# Patient Record
Sex: Female | Born: 1960 | Race: White | Hispanic: No | Marital: Married | Smoking: Current every day smoker
Health system: Southern US, Community
[De-identification: ages and names within clinical notes are randomized; demographics above are authoritative.]

## PROBLEM LIST (undated history)

## (undated) DIAGNOSIS — E785 Hyperlipidemia, unspecified: Secondary | ICD-10-CM

## (undated) DIAGNOSIS — M5020 Other cervical disc displacement, unspecified cervical region: Secondary | ICD-10-CM

## (undated) DIAGNOSIS — Z720 Tobacco use: Secondary | ICD-10-CM

## (undated) DIAGNOSIS — T7840XA Allergy, unspecified, initial encounter: Secondary | ICD-10-CM

## (undated) HISTORY — PX: COLONOSCOPY: SHX174

## (undated) HISTORY — DX: Other cervical disc displacement, unspecified cervical region: M50.20

## (undated) HISTORY — DX: Allergy, unspecified, initial encounter: T78.40XA

## (undated) HISTORY — DX: Tobacco use: Z72.0

## (undated) HISTORY — PX: ABDOMINAL HYSTERECTOMY: SHX81

## (undated) HISTORY — PX: SPINE SURGERY: SHX786

## (undated) HISTORY — DX: Hyperlipidemia, unspecified: E78.5

---

## 1999-03-04 ENCOUNTER — Emergency Department (HOSPITAL_COMMUNITY): Admission: EM | Admit: 1999-03-04 | Discharge: 1999-03-04 | Payer: Self-pay | Admitting: Emergency Medicine

## 1999-03-04 ENCOUNTER — Encounter: Payer: Self-pay | Admitting: Emergency Medicine

## 1999-03-09 HISTORY — PX: VAGINAL HYSTERECTOMY: SUR661

## 1999-03-10 ENCOUNTER — Encounter: Admission: RE | Admit: 1999-03-10 | Discharge: 1999-03-10 | Payer: Self-pay | Admitting: Family Medicine

## 1999-04-14 ENCOUNTER — Encounter: Admission: RE | Admit: 1999-04-14 | Discharge: 1999-04-14 | Payer: Self-pay | Admitting: Family Medicine

## 1999-04-16 ENCOUNTER — Encounter: Admission: RE | Admit: 1999-04-16 | Discharge: 1999-04-16 | Payer: Self-pay | Admitting: Family Medicine

## 1999-04-16 ENCOUNTER — Encounter: Payer: Self-pay | Admitting: Family Medicine

## 1999-05-04 ENCOUNTER — Encounter: Payer: Self-pay | Admitting: Family Medicine

## 1999-05-04 ENCOUNTER — Encounter: Admission: RE | Admit: 1999-05-04 | Discharge: 1999-05-04 | Payer: Self-pay | Admitting: Family Medicine

## 1999-07-16 ENCOUNTER — Encounter: Payer: Self-pay | Admitting: Obstetrics and Gynecology

## 1999-07-21 ENCOUNTER — Encounter (INDEPENDENT_AMBULATORY_CARE_PROVIDER_SITE_OTHER): Payer: Self-pay

## 1999-07-21 ENCOUNTER — Inpatient Hospital Stay (HOSPITAL_COMMUNITY): Admission: RE | Admit: 1999-07-21 | Discharge: 1999-07-22 | Payer: Self-pay | Admitting: Obstetrics and Gynecology

## 2000-06-09 ENCOUNTER — Encounter: Admission: RE | Admit: 2000-06-09 | Discharge: 2000-06-09 | Payer: Self-pay | Admitting: Family Medicine

## 2000-06-10 ENCOUNTER — Encounter: Admission: RE | Admit: 2000-06-10 | Discharge: 2000-06-10 | Payer: Self-pay | Admitting: Family Medicine

## 2000-06-10 ENCOUNTER — Encounter: Payer: Self-pay | Admitting: Family Medicine

## 2002-02-06 ENCOUNTER — Encounter: Admission: RE | Admit: 2002-02-06 | Discharge: 2002-02-06 | Payer: Self-pay | Admitting: Family Medicine

## 2003-04-29 ENCOUNTER — Encounter: Admission: RE | Admit: 2003-04-29 | Discharge: 2003-04-29 | Payer: Self-pay | Admitting: Family Medicine

## 2004-09-10 ENCOUNTER — Ambulatory Visit: Payer: Self-pay | Admitting: Family Medicine

## 2004-09-10 ENCOUNTER — Encounter (INDEPENDENT_AMBULATORY_CARE_PROVIDER_SITE_OTHER): Payer: Self-pay | Admitting: *Deleted

## 2004-09-10 LAB — CONVERTED CEMR LAB

## 2004-09-15 ENCOUNTER — Encounter: Admission: RE | Admit: 2004-09-15 | Discharge: 2004-09-15 | Payer: Self-pay | Admitting: Sports Medicine

## 2006-05-05 DIAGNOSIS — K219 Gastro-esophageal reflux disease without esophagitis: Secondary | ICD-10-CM

## 2006-05-05 DIAGNOSIS — F17200 Nicotine dependence, unspecified, uncomplicated: Secondary | ICD-10-CM

## 2006-05-05 DIAGNOSIS — D62 Acute posthemorrhagic anemia: Secondary | ICD-10-CM

## 2006-05-06 ENCOUNTER — Encounter (INDEPENDENT_AMBULATORY_CARE_PROVIDER_SITE_OTHER): Payer: Self-pay | Admitting: *Deleted

## 2007-08-12 ENCOUNTER — Emergency Department (HOSPITAL_COMMUNITY): Admission: EM | Admit: 2007-08-12 | Discharge: 2007-08-12 | Payer: Self-pay | Admitting: Emergency Medicine

## 2007-08-18 ENCOUNTER — Ambulatory Visit: Payer: Self-pay

## 2007-09-05 ENCOUNTER — Ambulatory Visit: Payer: Self-pay | Admitting: Cardiovascular Disease

## 2008-01-09 ENCOUNTER — Ambulatory Visit: Payer: Self-pay | Admitting: Family Medicine

## 2008-01-09 ENCOUNTER — Telehealth: Payer: Self-pay | Admitting: *Deleted

## 2008-01-09 DIAGNOSIS — R599 Enlarged lymph nodes, unspecified: Secondary | ICD-10-CM | POA: Insufficient documentation

## 2008-01-12 ENCOUNTER — Telehealth: Payer: Self-pay | Admitting: *Deleted

## 2008-03-14 ENCOUNTER — Ambulatory Visit: Payer: Self-pay | Admitting: Family Medicine

## 2008-03-14 ENCOUNTER — Telehealth: Payer: Self-pay | Admitting: *Deleted

## 2008-03-18 ENCOUNTER — Encounter (INDEPENDENT_AMBULATORY_CARE_PROVIDER_SITE_OTHER): Payer: Self-pay | Admitting: Family Medicine

## 2008-03-18 ENCOUNTER — Ambulatory Visit: Payer: Self-pay | Admitting: Family Medicine

## 2008-03-18 ENCOUNTER — Telehealth: Payer: Self-pay | Admitting: *Deleted

## 2008-07-04 ENCOUNTER — Emergency Department (HOSPITAL_COMMUNITY): Admission: EM | Admit: 2008-07-04 | Discharge: 2008-07-04 | Payer: Self-pay | Admitting: Family Medicine

## 2008-08-30 ENCOUNTER — Inpatient Hospital Stay (HOSPITAL_COMMUNITY): Admission: RE | Admit: 2008-08-30 | Discharge: 2008-09-01 | Payer: Self-pay | Admitting: Neurosurgery

## 2008-10-26 DIAGNOSIS — Z8679 Personal history of other diseases of the circulatory system: Secondary | ICD-10-CM | POA: Insufficient documentation

## 2009-02-26 ENCOUNTER — Telehealth: Payer: Self-pay | Admitting: Family Medicine

## 2009-03-03 ENCOUNTER — Ambulatory Visit: Payer: Self-pay | Admitting: Family Medicine

## 2009-03-03 DIAGNOSIS — H60339 Swimmer's ear, unspecified ear: Secondary | ICD-10-CM

## 2009-04-11 ENCOUNTER — Encounter: Payer: Self-pay | Admitting: Family Medicine

## 2009-08-05 ENCOUNTER — Ambulatory Visit (HOSPITAL_COMMUNITY): Admission: RE | Admit: 2009-08-05 | Discharge: 2009-08-05 | Payer: Self-pay | Admitting: Neurosurgery

## 2009-09-09 ENCOUNTER — Encounter: Payer: Self-pay | Admitting: Family Medicine

## 2009-09-09 ENCOUNTER — Ambulatory Visit: Payer: Self-pay | Admitting: Family Medicine

## 2009-09-09 DIAGNOSIS — J329 Chronic sinusitis, unspecified: Secondary | ICD-10-CM | POA: Insufficient documentation

## 2009-09-10 LAB — CONVERTED CEMR LAB
Albumin: 4.6 g/dL (ref 3.5–5.2)
Chloride: 108 meq/L (ref 96–112)
Glucose, Bld: 77 mg/dL (ref 70–99)
Hemoglobin: 15.5 g/dL — ABNORMAL HIGH (ref 12.0–15.0)
MCHC: 33.9 g/dL (ref 30.0–36.0)
MCV: 93.1 fL (ref 78.0–100.0)
Platelets: 295 10*3/uL (ref 150–400)
Potassium: 4.3 meq/L (ref 3.5–5.3)
RBC: 4.91 M/uL (ref 3.87–5.11)
TSH: 3.828 microintl units/mL (ref 0.350–4.500)
VLDL: 16 mg/dL (ref 0–40)
WBC: 8.7 10*3/uL (ref 4.0–10.5)

## 2009-09-15 ENCOUNTER — Encounter: Payer: Self-pay | Admitting: Family Medicine

## 2009-09-30 ENCOUNTER — Encounter: Payer: Self-pay | Admitting: Family Medicine

## 2010-04-07 NOTE — Consult Note (Signed)
Summary: [facility]  [facility]   Imported By: Clydell Hakim 04/18/2009 10:58:55  _____________________________________________________________________  External Attachment:    Type:   Image     Comment:   External Document

## 2010-04-07 NOTE — Miscellaneous (Signed)
  Clinical Lists Changes  Observations: Added new observation of DM PROGRESS: N/A (09/30/2009 8:24) Added new observation of DM FSREVIEW: N/A (09/30/2009 8:24) Added new observation of HTN PROGRESS: N/A (09/30/2009 8:24) Added new observation of HTN FSREVIEW: N/A (09/30/2009 8:24) Added new observation of LIPID PROGRS: N/A (09/30/2009 8:24) Added new observation of LIPID FSREVW: N/A (09/30/2009 8:24) Added new observation of MAMMOGRAM: normal (09/26/2009 8:25)      Prevention & Chronic Care Immunizations   Influenza vaccine: Not documented    Tetanus booster: 04/08/2001: Done.   Tetanus booster due: 04/09/2011    Pneumococcal vaccine: Not documented  Colorectal Screening   Hemoccult: Done.  (03/09/1999)   Hemoccult action/deferral: Not indicated  (09/09/2009)   Hemoccult due: 03/08/2000    Colonoscopy: Not documented   Colonoscopy action/deferral: GI referral  (09/09/2009)  Other Screening   Pap smear: Done.  (09/10/2004)   Pap smear action/deferral: Not indicated S/P hysterectomy  (09/09/2009)   Pap smear due: 09/10/2005    Mammogram: normal  (09/26/2009)   Mammogram action/deferral: Ordered  (09/09/2009)   Mammogram due: 03/16/2007   Smoking status: current  (09/09/2009)   Smoking cessation counseling: yes  (03/18/2008)  Lipids   Total Cholesterol: 210  (09/09/2009)   LDL: 147  (09/09/2009)   LDL Direct: Not documented   HDL: 47  (09/09/2009)   Triglycerides: 81  (09/09/2009)    Mammogram  Procedure date:  09/26/2009  Findings:      normal

## 2010-04-07 NOTE — Assessment & Plan Note (Signed)
Summary: cpe,df   Vital Signs:  Patient profile:   50 year old female Height:      66 inches Weight:      165.8 pounds BMI:     26.86 BP sitting:   137 / 61  (left arm) Cuff size:   regular  Vitals Entered By: Jimmy Footman, CMA (September 09, 2009 8:36 AM) CC: Adult phys Is Patient Diabetic? No Pain Assessment Patient in pain? no        Primary Care Provider:  Luretha Murphy NP  CC:  Adult phys.  History of Present Illness: Here for health maintenence exam.  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  Since then Kathleen Navarro has had severe consitpation.    Hysterectomy for fibroids in 2009, has occasional sensation of hormonal changes.  Chronic sinus problems, smokes and would like to quit.  Using loratadine to control symptoms and it seems to help.  GERD is improved and off meds.  Habits & Providers  Alcohol-Tobacco-Diet     Tobacco Status: current  Current Medications (verified): 1)  Wellbutrin 100 Mg Tabs (Bupropion Hcl) .... Two To Three Time Per Day  Allergies (verified): No Known Drug Allergies  Past History:  Past Medical History: Last updated: 10/26/2008 Current Problems:  CHEST PAIN, HX OF (ICD-V12.50): atypical normal myovue 08/2007 Palpitatons CERVICAL LYMPHADENOPATHY, ANTERIOR, LEFT (ICD-785.6) TOBACCO DEPENDENCE (ICD-305.1) GASTROESOPHAGEAL REFLUX, NO ESOPHAGITIS (ICD-530.81) ANEMIA, ACUTE BLOOD LOSS (ICD-285.1)    Review of Systems General:  Denies chills, fever, loss of appetite, malaise, sweats, and weakness. ENT:  Complains of postnasal drainage; denies difficulty swallowing, ear discharge, nasal congestion, ringing in ears, sinus pressure, and sore throat. Resp:  Denies cough, sputum productive, and wheezing. GI:  Complains of constipation, gas, and indigestion; denies abdominal pain. GU:  Denies discharge and dysuria. MS:  Denies joint pain.  Physical Exam  General:  Well appearing,  well groomed Ears:  TM retracted and reddened Nose:  no external deformity, no external erythema, and no nasal discharge.   Mouth:  good dentition, no dental plaque, and pharynx pink and moist.  Post nasal drainage present. Neck:  right anterior cervical node palpable, non tender Breasts:  No mass, nodules, thickening, tenderness, bulging, retraction, inflamation, nipple discharge or skin changes noted.   Lungs:  Normal respiratory effort, chest expands symmetrically. Lungs are clear to auscultation, no crackles or wheezes. Heart:  Normal rate and regular rhythm. S1 and S2 normal without gallop, murmur, click, rub or other extra sounds. Abdomen:  Bowel sounds positive,abdomen soft and non-tender without masses, organomegaly or hernias noted. Genitalia:  normal introitus, no external lesions, no vaginal discharge, and mucosa pink and moist.  Absent cervix. Msk:  normal ROM.   Pulses:  R and L carotid,radial,femoral,dorsalis pedis and posterior tibial pulses are full and equal bilaterally Extremities:  No clubbing, cyanosis, edema, or deformity noted with normal full range of motion of all joints.   Neurologic:  alert & oriented X3, cranial nerves II-XII intact, strength normal in all extremities, gait normal, DTRs symmetrical and normal, and finger-to-nose normal.   Skin:  Intact without suspicious lesions or rashes Cervical Nodes:  right anterior single node palpable Psych:  Cognition and judgment appear intact. Alert and cooperative.   Impression & Recommendations:  Problem # 1:  HEALTH MAINTENANCE EXAM (ICD-V70.0) Patient will schedule own colonoscopy and mammogram Orders: Comp Met-[facility] [id number]) Lipid-[facility] ([id number]) CBC-[facility] (29562) TSH-[facility] [phone]) [facility] -  Est  40-64 yrs [phone])  Problem # 2:  TOBACCO DEPENDENCE (ICD-305.1) Spend a lot of time discussing plan for this, prescribed Wellburtrin for when Kathleen Navarro decides to quit.  Kathleen Navarro cannot come to the class but will contact [place]  Quit.  Problem # 3:  CERVICAL LYMPHADENOPATHY (ICD-785.6) Has had past documentation on the left, today palpable on the right.  Chronic sinus problems, Kathleen Navarro will check and return if present in several months. The following medications were removed from the medication list:    Amoxicillin 500 Mg Tabs (Amoxicillin) .Marland Kitchen... 1 by mouth three times a day x 10 days  Problem # 4:  SINUSITIS, CHRONIC (ICD-473.9) nasal saline, smoking cessation, antihistamine; patient does not like the nasal steroid The following medications were removed from the medication list:    Amoxicillin 500 Mg Tabs (Amoxicillin) .Marland Kitchen... 1 by mouth three times a day x 10 days    Fluticasone Propionate 50 Mcg/act Susp (Fluticasone propionate) .Marland Kitchen... 2 sprays each nostril daily disp: one  Complete Medication List: 1)  Wellbutrin 100 Mg Tabs (Bupropion hcl) .... Two to three time per day  Patient Instructions: 1)  Polyethelyne Glycol-daily 2)  Cetirizine  3)  Please Quit Smoking Prescriptions: WELLBUTRIN 100 MG TABS (BUPROPION HCL) two to three time per day Brand medically necessary #90 x 3   Entered and Authorized by:   Luretha Murphy NP   Signed by:   Luretha Murphy NP on 09/09/2009   Method used:   Print then Give to Patient   RxID:   [id number]    Prevention & Chronic Care Immunizations   Influenza vaccine: Not documented    Tetanus booster: 04/08/2001: Done.   Tetanus booster due: 04/09/2011    Pneumococcal vaccine: Not documented  Colorectal Screening   Hemoccult: Done.  (03/09/1999)   Hemoccult action/deferral: Not indicated  (09/09/2009)   Hemoccult due: 03/08/2000    Colonoscopy: Not documented   Colonoscopy action/deferral: GI referral  (09/09/2009)  Other Screening   Pap smear: Done.  (09/10/2004)   Pap smear action/deferral: Not indicated S/P hysterectomy  (09/09/2009)   Pap smear due: 09/10/2005    Mammogram: Done.  (03/15/2006)   Mammogram action/deferral: Ordered  (09/09/2009)   Mammogram due:  03/16/2007   Smoking status: current  (09/09/2009)   Smoking cessation counseling: yes  (03/18/2008)  Lipids   Total Cholesterol: Not documented   LDL: Not documented   LDL Direct: Not documented   HDL: Not documented   Triglycerides: Not documented

## 2010-04-15 ENCOUNTER — Encounter: Payer: Self-pay | Admitting: *Deleted

## 2010-06-15 LAB — CBC
HCT: 45.1 % (ref 36.0–46.0)
Hemoglobin: 15.6 g/dL — ABNORMAL HIGH (ref 12.0–15.0)
MCHC: 34.5 g/dL (ref 30.0–36.0)
Platelets: 236 10*3/uL (ref 150–400)
RBC: 4.9 MIL/uL (ref 3.87–5.11)
RDW: 13 % (ref 11.5–15.5)

## 2010-07-21 NOTE — Assessment & Plan Note (Signed)
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  She was there on August 12, 2007, for chest pain.  She had  been having pains for 3-4 days prior to her ER visit.  The pain is  atypical.  It was nonexertional.  It is in the center of her chest that  lasted minutes.  It could radiate towards her back.  It subsequently  started to last longer.  She was seen in the ER.  She was not given  nitro.  The pain subsided after a while.  She ruled out myocardial  infarction and had a normal chest x-ray and EKG.  She subsequently was  referred to our office and had a Myoview study, which was done on August 18, 2007.  I personally reviewed the study, the EKG tracings, and the  actual images.  They were totally normal with an EF of 74%.  I went over  this with the patient.  Since that time, she continues to have atypical  symptoms.  She describes pain in her chest that is intermittent, not  related to food.  It is not exacerbated by recumbency.  They tend to  occur late morning or early afternoon, but there is no particular  activity that seems to bring them on.  There is nothing that she can do  to make them go away quicker.  She describes them as a pain in the  center of her chest.  There is no associated shortness of breath or  diaphoresis.  She subsequently can feel weak for a few minutes.  She  notices occasional PVCs, but in general does not have palpitations,  presyncope, or rapid heart rhythms.   REVIEW OF SYSTEMS:  Otherwise, negative.   PAST MEDICAL HISTORY:  Benign.  She has had a hysterectomy.   She is prone to anemia.  She has had fatigue and reflux.   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  She is happily married.  She has 3 older children and 4  grandchildren.  She does not drink.  She does smoke a pack a day.   FAMILY HISTORY:  Remarkable for her mother dying of cancer at age 38.  Father died of an MI at age 48.   MEDICATIONS:  She is currently not taking any medications.   ALLERGIES:  She is allergic to [facility].   PHYSICAL EXAMINATION:  Remarkable for a healthy-appearing 50 year old  female in no distress.  Affect is appropriate.  Blood pressure is 110/74, pulse 84 and regular, respiratory 14 and  afebrile, and weight is 121.  HEENT:  Unremarkable.  Carotids are normal without bruit.  No lymphadenopathy, thyromegaly, or  JVP elevation.  LUNGS:  Clear.  Good diaphragmatic motion.  No wheezing.  S1 and S2.  Normal heart  sounds.  PMI normal.  ABDOMEN:  Benign.  Bowel sounds positive.  Status post hysterectomy.  No  AAA.  No tenderness.  No bruit.  No hepatosplenomegaly.  No  hepatojugular reflux.  Distal pulses are intact.  No edema.  NEUROLOGIC:  Nonfocal.  SKIN:  Warm and dry.  No muscular weakness.   EKG:  Normal.  Chest x-ray from the ER on the August 12, 2007, normal.   IMPRESSION:  1. Atypical chest pain.  I told the patient to call me if her symptoms      become more exertional and more frequent.  I would give her some      sublingual nitro to try.  I also think that if her symptoms were to      change in character or increase in intensity to require another ER      visit that she would be a good candidate for cardiac CT.  For the      time being, I think with a normal Myoview, observation is still in      order.  2. Smoking.  I discussed with the patient briefly regarding smoking      cessation.  She will follow with her primary care MD.  Again, she      would be a reasonable candidate for Wellbutrin.  3. Palpitations.  Occasional premature ventricular contractions,      benign.  No evidence of decreased left ventricular  function.      Continue to monitor.     [name]. Eden Emms, MD, [facility]  Electronically Signed    PCN/MedQ  DD: 09/05/2007  DT: 09/06/2007  Job #: [id number]

## 2010-07-21 NOTE — Op Note (Signed)
NAMEMICHE, LOUGHRIDGE            ACCOUNT NO.:  [id number]   MEDICAL RECORD NO.:  [id number]          PATIENT TYPE:  INP   LOCATION:  3030                         FACILITY:  [facility]   PHYSICIAN:  Hilda Lias, M.D.   DATE OF BIRTH:  14-Jun-1960   DATE OF PROCEDURE:  08/30/2008  DATE OF DISCHARGE:                               OPERATIVE REPORT   PREOPERATIVE DIAGNOSES:  C4-C5, C5-C6 spondylosis, herniated disk with  right 5 and 6 radiculopathy.   POSTOPERATIVE DIAGNOSES:  C4-C5, C5-C6 spondylosis, herniated disk with  right 5 and 6 radiculopathy.   PROCEDURE:  Anterior 4-5, 5-6 diskectomy, decompression of spinal cord,  bilateral foraminotomy, interbody fusion with auto and allograft plate,  microscope.   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  She has complained of neck pain radiating to  right upper extremity.  This problem had been going for more than 4  weeks and she is not any better.  She had quite a bit of weakness of the  right deltoid and biceps.  X-ray shows stenosis with __________  herniated disk at the level of 4-5 and 5-6.  Surgery was advised in view  of no improvement.   PROCEDURE:  The patient was taken to the OR and after intubation, the  left side of the neck was cleaned with DuraPrep.  Transverse incision  was made through the skin, platysma, down to the cervical area.  X-rays  showed that indeed we were at the level of 5-6.  Then, the anterior  ligament of 4-5 and 5-6 were removed as well as the osteophyte.  We  started at the level of 4-5 where we did a total diskectomy, removing a  quite a bit of degenerative disk disease.  After we opened the posterior  ligament, we found that the both foramen were closed, the right worse  than the left one.  Using the drill as well as the 1- and 2-mm Kerrison  punch, we did a foraminotomy to decompress  the C5 nerve root  bilaterally.  At the level 5-6, we have opened the posterior ligament,  we found a piece of disk going to the right side.  The removal was done  with decompression of C6 nerve root.  The C6 nerve root on the right  side was swollen.  Foraminotomy on the left side was accomplished.  This  was followed with __________ 7-mm height, with autograft inside as well  as BMX.  Then, a plate using 6 screws was used to keep the graft in  place.  Lateral cervical spine showed good position of the graft as well  as the plate.  Hemostasis was accomplished.  We waited 5 minutes to be  sure that we had good hemostasis and in view of no bleeding, the wound  was closed with Vicryl and Steri-Strips.           ______________________________  Hilda Lias, M.D.     EB/MEDQ  D:  08/30/2008  T:  08/31/2008  Job:  [id number]

## 2010-07-24 NOTE — Op Note (Signed)
[facility]  Patient:    Kathleen Navarro, Kathleen Navarro               MRN: [id number] Proc. Date: 07/21/99 Adm. Date:  [id number] Disc. Date: [id number] Attending:  Jenean Lindau CC:         Wayne A. Sheffield Slider, M.D.             Laqueta Linden, M.D.                           Operative Report  PREOPERATIVE DIAGNOSIS:  Leiomyomata uteri, menorrhagia, anemia.  POSTOPERATIVE DIAGNOSIS:  Leiomyomata uteri, menorrhagia, anemia.  PROCEDURE:  Transvaginal hysterectomy.  SURGEON:  Laqueta Linden, M.D.  ASSISTANT:  Andres Ege, M.D.  ANESTHESIA:  Epidural with IV sedation.  ESTIMATED BLOOD LOSS:  Less than 50 cc.  URINE OUTPUT:  70 cc, catheterized at the conclusion of the procedure.  FLUIDS:  100 cc of crystalloid.  COUNTS:  Correct x 2.  COMPLICATIONS:  None.  INDICATION FOR PROCEDURE:  Kathleen Navarro is a 50 year old, gravida 3, para 3, married female who presented with a hemoglobin of 10 complaining of perfuse menorrhagia that was gradually worsening over the past several years. She underwent pelvic ultrasound which revealed numerous fibroids measuring from 1 to 3 cm in diameter with at least 1 submucosal fibroid and an additional submucosal fibroid versus endometrial polyp. She was counseled as to the treatment alternatives including embolization versus endometrial resection and/or oblation versus hysterectomy. She is not a candidate for combination pills due to her smoking history. She elected to proceed with definitive surgical management. She is to undergo vaginal hysterectomy and a region anesthesia. She has been extensively counseled as to the risks, benefits, options and complications including the permanent and irreversible sterilization as a result. Her ovaries will be retained due to her young age. She has seen the informed consent film. Her questions have been answered and she agrees to proceed. She has received Ancef 1 gm IV antibiotic  prophylaxis preoperatively.  DESCRIPTION OF PROCEDURE:  The patient was taken to the operating room and after proper identification and consents were ascertained, she was placed on the operating table in the supine position. Dr. Dorathy Kinsman then placed an epidural catheter with the patient sitting, and she was returned to the supine position. After she started obtaining a level, she was placed in the candy cane stirrups and the perineum and vagina were prepped and draped in the routine sterile fashion. A red rubber catheter was used to empty the bladder prior to the procedure. The status of the epidural was tested and felt to be giving good anesthesia. A weighted speculum was placed in the posterior vagina. The cervix was grasped with a single tooth tenaculum. An average amount of descent was noted. The cervix was injected circumferentially with a 1:100,000 solution of epinephrine. The portio was then circumscribed with a scalpel and the vaginal mucosa advanced bluntly off of the cervix with a finger and a Raytec sponge. The posterior mucosa was then tented down and the cul-de-sac entered sharply used curved Mayo scissors. The uterosacral ligaments were clamped, cut and Heaney ligated with #0 Vicryl suture and tagged. The cardinal ligaments were similarly clamped, cut and suture ligated. The anterior peritoneal reflection was identified and entered sharply without obvious injury or entry into the bladder. The retractor was then placed to retract the bladder anteriorly out of the operative field. The uterine vessels were then  ligated bilaterally, were clamped, cut and suture ligated incorporating both anterior and posterior peritoneum. Several pedicles were taken in the broad ligament all of which had prominent vasculature. At this point, an attempt was made to flip the uterus posteriorly. However, the fundus was noted to be quite distended with multiple fibroids. A coring procedure was then  performed with excision of the cervix and some reduction in uterine bulk. Several small fibroids were removed from within the uterine cavity. An additional coring procedure was then performed with further reduction in the uterine bulk. At this point, it became possible to flip the uterus posteriorly. Curved Heaney clamps were then placed across the proximal adnexal pedicles bilaterally with excision of specimen which was sent in multiple pieces to pathology. There were 2 small cystic structures attached to the right fallopian tube and these were excised and sent as a separate specimen to rule out endometriosis. No other endometriosis or lesions were identified. Both tubes and ovaries appeared completely normal. The adnexal pedicles were triply ligated with 2 free ties and a stitch of #0 Vicryl. The posterior vaginal mucosa was then reefed to the posterior peritoneum for hemostasis. The parietoperitoneum was then closed in pursestring fashion using 2-0 Vicryl suture after confirming hemostasis at the adnexal pedicles and release of the pedicles into the peritoneal cavity. At this point, the vaginal cuff was closed from side-to-side utilizing figure-of-eight sutures of #0 Vicryl. The uterosacral tags were tied in the midline. No additional support sutures were placed as the patient had excellent vaginal support and no prolapse whatsoever. After vaginal closure, hemostasis was noted to be excellent. The bladder was then emptied of 70 cc of clear urine with an in and out catheter. Estimated blood loss was less than 50 cc. Counts were correct x 2. Complications none. The patient was stable on transfer to the recovery room. DD:  07/21/99 TD:  07/23/99 Job: [id number]

## 2010-12-03 LAB — CBC
HCT: 41.1
MCHC: 34.8
Platelets: 234
RBC: 4.5
RDW: 13.2
WBC: 20 — ABNORMAL HIGH

## 2010-12-03 LAB — POCT I-STAT, CHEM 8
Glucose, Bld: 95
HCT: 42
Hemoglobin: 14.3
Potassium: 3.8
Sodium: 139
TCO2: 21

## 2010-12-03 LAB — URINE MICROSCOPIC-ADD ON

## 2010-12-03 LAB — DIFFERENTIAL
Basophils Relative: 0
Lymphocytes Relative: 13
Monocytes Absolute: 0.9
Monocytes Relative: 5

## 2010-12-03 LAB — POCT CARDIAC MARKERS
CKMB, poc: 1
CKMB, poc: <1 — ABNORMAL LOW
Troponin i, poc: <0.05
Troponin i, poc: <0.05

## 2010-12-03 LAB — PROTIME-INR: Prothrombin Time: 12.2

## 2010-12-03 LAB — URINALYSIS, ROUTINE W REFLEX MICROSCOPIC
Ketones, ur: NEGATIVE
Protein, ur: NEGATIVE
Specific Gravity, Urine: 1.012
pH: 7.5

## 2010-12-03 LAB — D-DIMER, QUANTITATIVE: D-Dimer, Quant: 0.42

## 2011-03-15 ENCOUNTER — Other Ambulatory Visit: Payer: Self-pay | Admitting: Dermatology

## 2011-04-01 ENCOUNTER — Other Ambulatory Visit: Payer: Self-pay | Admitting: Dermatology

## 2011-09-13 ENCOUNTER — Ambulatory Visit (INDEPENDENT_AMBULATORY_CARE_PROVIDER_SITE_OTHER): Payer: 59 | Admitting: Family Medicine

## 2011-09-13 ENCOUNTER — Other Ambulatory Visit: Payer: Self-pay | Admitting: Dermatology

## 2011-09-13 ENCOUNTER — Encounter: Payer: Self-pay | Admitting: Family Medicine

## 2011-09-13 VITALS — BP 119/80 | HR 79 | Temp 98.3°F | Ht 66.0 in | Wt 173.0 lb

## 2011-09-13 DIAGNOSIS — Z6825 Body mass index (BMI) 25.0-25.9, adult: Secondary | ICD-10-CM

## 2011-09-13 DIAGNOSIS — Z0000 Encounter for general adult medical examination without abnormal findings: Secondary | ICD-10-CM

## 2011-09-13 DIAGNOSIS — E663 Overweight: Secondary | ICD-10-CM | POA: Insufficient documentation

## 2011-09-13 DIAGNOSIS — M25519 Pain in unspecified shoulder: Secondary | ICD-10-CM

## 2011-09-13 DIAGNOSIS — M25511 Pain in right shoulder: Secondary | ICD-10-CM | POA: Insufficient documentation

## 2011-09-13 DIAGNOSIS — F17200 Nicotine dependence, unspecified, uncomplicated: Secondary | ICD-10-CM

## 2011-09-13 MED ORDER — MELOXICAM 15 MG PO TABS: 15.0000 mg | ORAL_TABLET | Freq: Every day | ORAL | Status: AC

## 2011-09-13 NOTE — Assessment & Plan Note (Signed)
Discussed different options to help her quit- ie nicotine replacement, chantix.  She seemed interested in smoking cessation counseling with Dr. Raymondo Band, I advised her to make an appointment.

## 2011-09-13 NOTE — Assessment & Plan Note (Signed)
Suspect rotator cuff strain +/- biceps strain.  Strength in tact.  Will treat with scheduled mobic and ROM home exercises given.  Patient instructed to follow up in 4 weeks if not improving.

## 2011-09-13 NOTE — Assessment & Plan Note (Signed)
Will check lipids and BMET as she was borderline HLD about two years ago.

## 2011-09-13 NOTE — Patient Instructions (Signed)
It was good to meet you.  For your shoulder- please see the attached hand out with exercises.  Please take the Mobic daily for one week then as needed for pain.   Please make an appointment at the front desk for a lab visit - please come fasting to this lab draw.  I will send you a letter with the results.   If you decide you want to meet with Dr. Raymondo Band, our pharmacist, for smoking cessation counseling, you can call the office or let them know at the front desk.

## 2011-09-13 NOTE — Progress Notes (Signed)
  Subjective:    Patient ID: Kathleen Navarro, female    DOB: [birth year], 51 y.o.   MRN: [id number]  HPI  [name] comes in for her annual physical.  She is generally feeling well.  She is up to date with her immunizations.  She had a hysterectomy including her cervix so does not need a pap smear.   Right Shoulder pain- she fell about two weeks ago walking down a wet grassy hill in her yard.  It hurt very badly initially, but no pop.  She took some ibuprofen, and felt better, but now it has started aching again.  It hurts to put dishes away in a cabinet above her head or pick up a heavy bucket.  No numbness or tingling in hand.   Tobacco Use- has smoked for about 30 years.  Says she is smoking 1.5 ppd right now, but is not happy about it.  She says she wants to quit, but not able to stay quit.  She is not sure if nicotine replacement would help her.  She says it is very important to her to quit, but is not sure if she can do it.    Review of Systems See HPI.     Objective:   Physical Exam BP 119/80  Pulse 79  Temp 98.3 F (36.8 C) (Oral)  Ht 5\' 6"  (1.676 m)  Wt 173 lb (78.472 kg)  BMI 27.92 kg/m2 General appearance: alert, cooperative and no distress Eyes: conjunctivae/corneas clear. PERRL, EOM's intact. Fundi benign. Throat: lips, mucosa, and tongue normal; teeth and gums normal Neck: no adenopathy, no JVD, supple, symmetrical, trachea midline and thyroid not enlarged, symmetric, no tenderness/mass/nodules Lungs: clear to auscultation bilaterally Heart: regular rate and rhythm, S1, S2 normal, no murmur, click, rub or gallop Extremities: extremities normal, atraumatic, no cyanosis or edema Pulses: 2+ and symmetric  Right Shoulder: Inspection reveals no abnormalities, atrophy or asymmetry. Palpation shows bicipital groove. ROM is full in all planes. Rotator cuff strength normal throughout, but pain with biceps strength and external rotation testing.  Positive Hawkin's tests,  empty can. No labral pathology noted Normal scapular function observed.       Assessment & Plan:

## 2011-09-14 ENCOUNTER — Other Ambulatory Visit: Payer: 59

## 2011-09-14 DIAGNOSIS — E663 Overweight: Secondary | ICD-10-CM

## 2011-09-14 LAB — BASIC METABOLIC PANEL
CO2: 27 mEq/L (ref 19–32)
Glucose, Bld: 77 mg/dL (ref 70–99)
Potassium: 4.8 mEq/L (ref 3.5–5.3)
Sodium: 141 mEq/L (ref 135–145)

## 2011-09-14 LAB — LIPID PANEL
LDL Cholesterol: 142 mg/dL — ABNORMAL HIGH (ref 0–99)
Total CHOL/HDL Ratio: 5 Ratio
VLDL: 24 mg/dL (ref 0–40)

## 2011-09-14 NOTE — Progress Notes (Signed)
BMP AND FLP DONE TODAY Kathleen Navarro 

## 2011-09-16 ENCOUNTER — Encounter: Payer: Self-pay | Admitting: Family Medicine

## 2013-06-27 ENCOUNTER — Encounter: Payer: Self-pay | Admitting: Gastroenterology

## 2013-06-27 ENCOUNTER — Other Ambulatory Visit: Payer: Self-pay | Admitting: Internal Medicine

## 2013-06-27 DIAGNOSIS — Z1231 Encounter for screening mammogram for malignant neoplasm of breast: Secondary | ICD-10-CM

## 2013-07-02 LAB — IFOBT (OCCULT BLOOD): IFOBT: POSITIVE

## 2013-07-03 ENCOUNTER — Encounter: Payer: Self-pay | Admitting: *Deleted

## 2013-07-04 ENCOUNTER — Ambulatory Visit (INDEPENDENT_AMBULATORY_CARE_PROVIDER_SITE_OTHER): Payer: 59 | Admitting: Physician Assistant

## 2013-07-04 ENCOUNTER — Encounter: Payer: Self-pay | Admitting: Physician Assistant

## 2013-07-04 ENCOUNTER — Ambulatory Visit
Admission: RE | Admit: 2013-07-04 | Discharge: 2013-07-04 | Disposition: A | Discharge status: Home / Self Care | Payer: 59 | Source: Ambulatory Visit | Attending: Internal Medicine | Admitting: Internal Medicine

## 2013-07-04 VITALS — BP 122/78 | HR 74 | Ht 65.0 in | Wt 172.0 lb

## 2013-07-04 DIAGNOSIS — Z981 Arthrodesis status: Secondary | ICD-10-CM | POA: Insufficient documentation

## 2013-07-04 DIAGNOSIS — R195 Other fecal abnormalities: Secondary | ICD-10-CM

## 2013-07-04 DIAGNOSIS — K5900 Constipation, unspecified: Secondary | ICD-10-CM

## 2013-07-04 DIAGNOSIS — Z1231 Encounter for screening mammogram for malignant neoplasm of breast: Secondary | ICD-10-CM

## 2013-07-04 MED ORDER — MOVIPREP 100 G PO SOLR: 1.0000 | ORAL | Status: DC

## 2013-07-04 NOTE — Patient Instructions (Signed)
You have been scheduled for a possible endoscopy and colonoscopy with propofol. Please follow the written instructions given to you at your visit today. Please pick up your prep at the pharmacy within the next 1-3 days. [place]

## 2013-07-04 NOTE — Progress Notes (Signed)
I agree with colonoscopy +/- EGD for hemocult positive stool

## 2013-07-04 NOTE — Progress Notes (Signed)
Subjective:    Patient ID: Kathleen Navarro, female    DOB: 11-27-1960, 53 y.o.   MRN: [id number]  HPI Kathleen Navarro is a pleasant 54 year-old female referred today by Dr. Lodema Hong for Hemoccult-positive stool. Patient had a recent physical and had a heme assure heart return positive. We have received copies of her labs and hemoglobin was normal at 15.8 hematocrit of 46.1 MCV of 93 platelets 288. She has not had any prior GI evaluation.  Patient says she gets occasional bloating gas and constipation and uses Dulcolax on a when necessary basis. She said constipation started 5 or 6 years ago at the time she had her cervical spine fusion. She has not noticed any recent changes in her bowel habits no melena or hematochezia. Appetite has been fine weight has been stable no dysphagia or odynophagia. She gets occasional heartburn. She is not on any regular aspirin or NSAIDs.  Family history is positive for a grandmother with esophageal cancer, negative for colon cancer polyps.  She is generally healthy -she is status post hysterectomy and spinal fusion and has tobacco dependence.   Review of Systems  Constitutional: Negative.   HENT: Negative.   Eyes: Negative.   Respiratory: Negative.   Cardiovascular: Negative.   Gastrointestinal: Positive for constipation.  Endocrine: Negative.   Genitourinary: Negative.   Musculoskeletal: Negative.   Allergic/Immunologic: Negative.   Neurological: Negative.   Hematological: Negative.   Psychiatric/Behavioral: Negative.    Outpatient Prescriptions Prior to Visit  Medication Sig Dispense Refill  . pregabalin (LYRICA) 75 MG capsule Take 75 mg by mouth at bedtime.       No facility-administered medications prior to visit.       Allergies  Allergen Reactions  . Macrodantin [Nitrofurantoin Macrocrystal]    Patient Active Problem List   Diagnosis Date Noted  . S/P cervical spinal fusion 07/04/2013  . Overweight (BMI 25.0-29.9) 09/13/2011  .  Shoulder pain, right 09/13/2011  . SINUSITIS, CHRONIC 09/09/2009  . OTITIS EXTERNA, ACUTE 03/03/2009  . CHEST PAIN, HX OF 10/26/2008  . Enlargement of Lymph Nodes 01/09/2008  . ANEMIA, ACUTE BLOOD LOSS 05/05/2006  . TOBACCO DEPENDENCE 05/05/2006  . GASTROESOPHAGEAL REFLUX, NO ESOPHAGITIS 05/05/2006   History  Substance Use Topics  . Smoking status: Current Every Day Smoker -- 1.50 packs/day    Types: Cigarettes  . Smokeless tobacco: Never Used  . Alcohol Use: No   family history includes Heart disease in her father; Hypertension in her mother and sister; Lung cancer in her mother; Stroke in her mother.  Objective:   Physical Exam  well-developed white female in no acute distress, pleasant blood pressure 122/78 pulse 74 height 5 foot 5 weight 172 BMI 28. HEENT; nontraumatic normocephalic sclera anicteric, Supple s cervical incisional scar, Cardiovascular; regular rate and rhythm with S1-S2 no murmur or gallop, Pulmonary ;clear bilaterally, Abdomen; soft nontender nondistended bowel sounds are active there is no palpable mass or hepatosplenomegaly, Rectal; exam not done was documented Hemoccult positive that recent physical, Extremities ;no clubbing cyanosis or edema skin warm and dry, Psych; mood and affect appropriate        Assessment & Plan:  #38  53 year old female with Hemoccult-positive stool documented at recent physical. No active GI symptoms other than mild chronic constipation Average risk for colon cancer Rule out occult colonic or gastric lesion #2 status post cervical spine fusion #3 status post hysterectomy #4 tobacco abuse  Plan; Patient is scheduled for colonoscopy and possible EGD with Dr. Ardis Hughs. Procedures were  discussed in detail with the patient and she is agreeable to proceed

## 2013-07-16 ENCOUNTER — Encounter: Payer: Self-pay | Admitting: Gastroenterology

## 2013-07-23 ENCOUNTER — Encounter: Payer: Self-pay | Admitting: Physician Assistant

## 2013-08-01 ENCOUNTER — Encounter: Payer: Self-pay | Admitting: Gastroenterology

## 2013-08-01 ENCOUNTER — Ambulatory Visit (AMBULATORY_SURGERY_CENTER): Payer: 59 | Admitting: Gastroenterology

## 2013-08-01 VITALS — BP 136/85 | HR 76 | Temp 98.5°F | Resp 28 | Ht 65.0 in | Wt 172.0 lb

## 2013-08-01 DIAGNOSIS — R195 Other fecal abnormalities: Secondary | ICD-10-CM

## 2013-08-01 MED ORDER — SODIUM CHLORIDE 0.9 % IV SOLN: 500.0000 mL | INTRAVENOUS | Status: DC

## 2013-08-01 NOTE — Progress Notes (Signed)
Stable to RR 

## 2013-08-01 NOTE — Patient Instructions (Addendum)
One of your biggest health concerns is your smoking.  This increases your risk for most cancers and serious cardiovascular diseases such as strokes, heart attacks.  You should try your best to stop.  If you need assistance, please contact your PCP or Smoking Cessation Class at [facility] [phone]) or [place] (1-800-QUIT-NOW).  Discharge instructions given with verbal understanding. Normal exams. Resume previous medications. YOU HAD AN ENDOSCOPIC PROCEDURE TODAY AT [facility]: Refer to the procedure report that was given to you for any specific questions about what was found during the examination.  If the procedure report does not answer your questions, please call your gastroenterologist to clarify.  If you requested that your care partner not be given the details of your procedure findings, then the procedure report has been included in a sealed envelope for you to review at your convenience later.  YOU SHOULD EXPECT: Some feelings of bloating in the abdomen. Passage of more gas than usual.  Walking can help get rid of the air that was put into your GI tract during the procedure and reduce the bloating. If you had a lower endoscopy (such as a colonoscopy or flexible sigmoidoscopy) you may notice spotting of blood in your stool or on the toilet paper. If you underwent a bowel prep for your procedure, then you may not have a normal bowel movement for a few days.  DIET: Your first meal following the procedure should be a light meal and then it is ok to progress to your normal diet.  A half-sandwich or bowl of soup is an example of a good first meal.  Heavy or fried foods are harder to digest and may make you feel nauseous or bloated.  Likewise meals heavy in dairy and vegetables can cause extra gas to form and this can also increase the bloating.  Drink plenty of fluids but you should avoid alcoholic beverages for 24 hours.  ACTIVITY: Your care partner should take  you home directly after the procedure.  You should plan to take it easy, moving slowly for the rest of the day.  You can resume normal activity the day after the procedure however you should NOT DRIVE or use heavy machinery for 24 hours (because of the sedation medicines used during the test).    SYMPTOMS TO REPORT IMMEDIATELY: A gastroenterologist can be reached at any hour.  During normal business hours, 8:30 AM to 5:00 PM Monday through Friday, call [phone].  After hours and on weekends, please call the GI answering service at [phone] who will take a message and have the physician on call contact you.   Following lower endoscopy (colonoscopy or flexible sigmoidoscopy):  Excessive amounts of blood in the stool  Significant tenderness or worsening of abdominal pains  Swelling of the abdomen that is new, acute  Fever of 100F or higher  Following upper endoscopy (EGD)  Vomiting of blood or coffee ground material  New chest pain or pain under the shoulder blades  Painful or persistently difficult swallowing  New shortness of breath  Fever of 100F or higher  Black, tarry-looking stools  FOLLOW UP: If any biopsies were taken you will be contacted by phone or by letter within the next 1-3 weeks.  Call your gastroenterologist if you have not heard about the biopsies in 3 weeks.  Our staff will call the home number listed on your records the next business day following your procedure to check on you and address any questions  or concerns that you may have at that time regarding the information given to you following your procedure. This is a courtesy call and so if there is no answer at the home number and we have not heard from you through the emergency physician on call, we will assume that you have returned to your regular daily activities without incident.  SIGNATURES/CONFIDENTIALITY: You and/or your care partner have signed paperwork which will be entered into your electronic  medical record.  These signatures attest to the fact that that the information above on your After Visit Summary has been reviewed and is understood.  Full responsibility of the confidentiality of this discharge information lies with you and/or your care-partner.

## 2013-08-01 NOTE — Op Note (Signed)
[place]  [address] [place], [address]   COLONOSCOPY PROCEDURE REPORT  PATIENT: Kathleen, Navarro  MR#: [id number] BIRTHDATE: 1960/08/19 , 53  yrs. old GENDER: Female ENDOSCOPIST: Milus Banister, MD REFERRED QQ:[id number] Ardeth Perfect, M.D. PROCEDURE DATE:  08/01/2013 PROCEDURE:   Colonoscopy, diagnostic First Screening Colonoscopy - Avg.  risk and is 50 yrs.  old or older - No.  Prior Negative Screening - Now for repeat screening. N/A  History of Adenoma - Now for follow-up colonoscopy & has been > or = to 3 yrs.  N/A  Polyps Removed Today? No.  Recommend repeat exam, <10 yrs? No. ASA CLASS:   Class II INDICATIONS:FOBT positive stool. MEDICATIONS: MAC sedation, administered by CRNA and propofol (Diprivan) 250mg  IV  DESCRIPTION OF PROCEDURE:   After the risks benefits and alternatives of the procedure were thoroughly explained, informed consent was obtained.  A digital rectal exam revealed no abnormalities of the rectum.   The LB [id number]  endoscope was introduced through the anus and advanced to the cecum, which was identified by both the appendix and ileocecal valve. No adverse events experienced.   The quality of the prep was excellent.  The instrument was then slowly withdrawn as the colon was fully examined.   COLON FINDINGS: A normal appearing cecum, ileocecal valve, and appendiceal orifice were identified.  The ascending, hepatic flexure, transverse, splenic flexure, descending, sigmoid colon and rectum appeared unremarkable.  No polyps or cancers were seen. Retroflexed views revealed no abnormalities. The time to cecum=6 minutes 03 seconds.  Withdrawal time=6 minutes 33 seconds.  The scope was withdrawn and the procedure completed. COMPLICATIONS: There were no complications.  ENDOSCOPIC IMPRESSION: Normal colon No polyps or cancers  RECOMMENDATIONS: You should continue to follow colorectal cancer screening guidelines for "routine  risk" patients with a repeat colonoscopy in 10 years. Will proceed with EGD now.   eSigned:  Milus Banister, MD 08/01/2013 2:49 PM

## 2013-08-01 NOTE — Op Note (Signed)
[place]  [address]. [place], [id number]   ENDOSCOPY PROCEDURE REPORT  PATIENT: Kathleen Navarro, Kathleen Navarro  MR#: [id number] BIRTHDATE: 02-08-1961 , 53  yrs. old GENDER: Female ENDOSCOPIST: Milus Banister, MD PROCEDURE DATE:  08/01/2013 PROCEDURE:  EGD, diagnostic ASA CLASS:     Class II INDICATIONS:  FOBT positive stool. MEDICATIONS: MAC sedation, administered by CRNA and propofol (Diprivan) 100mg  IV TOPICAL ANESTHETIC: Cetacaine Spray  DESCRIPTION OF PROCEDURE: After the risks benefits and alternatives of the procedure were thoroughly explained, informed consent was obtained.  The LB [id number] endoscope was introduced through the mouth and advanced to the second portion of the duodenum. Without limitations.  The instrument was slowly withdrawn as the mucosa was fully examined.     The upper, middle and distal third of the esophagus were carefully inspected and no abnormalities were noted.  The z-line was well seen at the GEJ.  The endoscope was pushed into the fundus which was normal including a retroflexed view.  The antrum, gastric body, first and second part of the duodenum were unremarkable. Retroflexed views revealed no abnormalities.     The scope was then withdrawn from the patient and the procedure completed. COMPLICATIONS: There were no complications.  ENDOSCOPIC IMPRESSION: Normal EGD  RECOMMENDATIONS: Follow clinically   eSigned:  Milus Banister, MD 08/01/2013 2:55 PM   cc: Velna Hatchet, MD

## 2013-08-02 ENCOUNTER — Telehealth: Payer: Self-pay | Admitting: *Deleted

## 2013-08-02 NOTE — Telephone Encounter (Signed)
  Follow up Call-  Call back number 08/01/2013  Post procedure Call Back phone  # [phone]  or cell [phone]  Permission to leave phone message Yes     Patient questions:  Do you have a fever, pain , or abdominal swelling? no Pain Score  0 *  Have you tolerated food without any problems? yes  Have you been able to return to your normal activities? yes  Do you have any questions about your discharge instructions: Diet   no Medications  no Follow up visit  no  Do you have questions or concerns about your Care? no  Actions: * If pain score is 4 or above: No action needed, pain <4.

## 2013-08-10 ENCOUNTER — Encounter: Payer: 59 | Admitting: Gastroenterology

## 2013-11-07 ENCOUNTER — Other Ambulatory Visit: Payer: Self-pay | Admitting: Orthopaedic Surgery

## 2013-11-07 ENCOUNTER — Ambulatory Visit
Admission: RE | Admit: 2013-11-07 | Discharge: 2013-11-07 | Disposition: A | Discharge status: Home / Self Care | Payer: 59 | Source: Ambulatory Visit | Attending: Orthopaedic Surgery | Admitting: Orthopaedic Surgery

## 2013-11-07 DIAGNOSIS — M47812 Spondylosis without myelopathy or radiculopathy, cervical region: Secondary | ICD-10-CM

## 2014-07-02 ENCOUNTER — Other Ambulatory Visit: Payer: Self-pay | Admitting: Internal Medicine

## 2014-07-02 DIAGNOSIS — F17200 Nicotine dependence, unspecified, uncomplicated: Secondary | ICD-10-CM

## 2014-07-04 ENCOUNTER — Other Ambulatory Visit: Payer: Self-pay

## 2014-07-04 DIAGNOSIS — Z1231 Encounter for screening mammogram for malignant neoplasm of breast: Secondary | ICD-10-CM

## 2014-07-10 ENCOUNTER — Ambulatory Visit
Admission: RE | Admit: 2014-07-10 | Discharge: 2014-07-10 | Disposition: A | Discharge status: Home / Self Care | Payer: Medicare Other | Source: Ambulatory Visit | Attending: Internal Medicine | Admitting: Internal Medicine

## 2014-07-10 ENCOUNTER — Encounter

## 2014-07-10 DIAGNOSIS — F17200 Nicotine dependence, unspecified, uncomplicated: Secondary | ICD-10-CM

## 2014-07-11 ENCOUNTER — Ambulatory Visit
Admission: RE | Admit: 2014-07-11 | Discharge: 2014-07-11 | Disposition: A | Discharge status: Home / Self Care | Payer: Medicare Other | Source: Ambulatory Visit

## 2014-07-11 DIAGNOSIS — Z1231 Encounter for screening mammogram for malignant neoplasm of breast: Secondary | ICD-10-CM

## 2014-07-29 ENCOUNTER — Encounter: Payer: Self-pay | Admitting: Acute Care

## 2014-08-02 ENCOUNTER — Telehealth: Payer: Self-pay | Admitting: Acute Care

## 2014-08-02 NOTE — Telephone Encounter (Signed)
I called and spoke with Kathleen Navarro about her low dose CT results. She had been contacted by her PCP previously about the fact she will need a follow up scan in 6 months. I was able to answer some questions she had about the nodule found on her earlier scan. I explained to her that a Lung RADS 3 nodule is probably benign, and that short term follow up is suggested: includes nodules with a low likelihood of becoming clinically active cancers.She verbalized understanding and told me she had no further questions. I told her I would be in touch in late October of 2016 to get her early November 2016 scan ordered and scheduled. She verbalized understanding.

## 2015-01-02 ENCOUNTER — Other Ambulatory Visit: Payer: Self-pay | Admitting: Acute Care

## 2015-01-02 DIAGNOSIS — F17210 Nicotine dependence, cigarettes, uncomplicated: Principal | ICD-10-CM

## 2015-01-10 ENCOUNTER — Other Ambulatory Visit: Payer: Self-pay | Admitting: Acute Care

## 2015-01-10 DIAGNOSIS — R911 Solitary pulmonary nodule: Secondary | ICD-10-CM

## 2015-01-13 ENCOUNTER — Ambulatory Visit
Admission: RE | Admit: 2015-01-13 | Discharge: 2015-01-13 | Disposition: A | Discharge status: Home / Self Care | Payer: Medicare Other | Source: Ambulatory Visit | Attending: Acute Care | Admitting: Acute Care

## 2015-01-13 DIAGNOSIS — R911 Solitary pulmonary nodule: Secondary | ICD-10-CM

## 2015-01-14 ENCOUNTER — Telehealth: Payer: Self-pay | Admitting: Acute Care

## 2015-01-14 NOTE — Telephone Encounter (Signed)
I have called the 6 month follow up LDCT results to Kathleen Navarro. I explained that the scan resulted in a Lung RADS 2 reading. I told her that the 6.2 mm nodule found in the previous scan was unchanged, and considered stable. I also explained that there was an additional nodule found in the follow up scan that measured 10.4 mm. I told her that when the radiologist who read todays scan went back and reviewed the scan done in May, that the nodule was actually present on that scan. I explained that he was able to compare the the nodule now to the nodule 6 months ago, and that it is unchanged. Therefore another stable nodule. I explained that the recommendation is for a follow up scan in 12 months. I told her we will call and schedule the scan mid October 2017 for the beginning of November 2017. She verbalized understanding of all of the above and had no further questions before ending the call. She has my contact information in the event she has any additional questions. I told her I would let Dr. Ardeth Perfect know the results, which I have faxed to him, and also left a voice mail at his office, which he has subsequently responded to and acknowledged.

## 2015-01-14 NOTE — Telephone Encounter (Signed)
I called [facility] to give Dr. Ardeth Perfect the LDCT 6 month follow up results. These results were called to me by Dr. Weber Cooks. I spoke with the receptionist who put me into the voice mail of Amy, the [facility] working with Dr. Ardeth Perfect today. I did leave a message and marked it as urgent, as I would like to get the results to the patient in a timely manner.

## 2015-05-28 ENCOUNTER — Other Ambulatory Visit: Payer: Self-pay | Admitting: Acute Care

## 2015-05-28 DIAGNOSIS — F17210 Nicotine dependence, cigarettes, uncomplicated: Secondary | ICD-10-CM

## 2015-09-15 ENCOUNTER — Other Ambulatory Visit: Payer: Self-pay | Admitting: Internal Medicine

## 2015-09-15 DIAGNOSIS — Z1231 Encounter for screening mammogram for malignant neoplasm of breast: Secondary | ICD-10-CM

## 2015-09-22 ENCOUNTER — Ambulatory Visit
Admission: RE | Admit: 2015-09-22 | Discharge: 2015-09-22 | Disposition: A | Discharge status: Home / Self Care | Payer: Medicare Other | Source: Ambulatory Visit | Attending: Internal Medicine | Admitting: Internal Medicine

## 2015-09-22 DIAGNOSIS — Z1231 Encounter for screening mammogram for malignant neoplasm of breast: Secondary | ICD-10-CM

## 2016-01-26 ENCOUNTER — Ambulatory Visit
Admission: RE | Admit: 2016-01-26 | Discharge: 2016-01-26 | Disposition: A | Discharge status: Home / Self Care | Payer: Medicare Other | Source: Ambulatory Visit | Attending: Acute Care | Admitting: Acute Care

## 2016-01-26 DIAGNOSIS — F17210 Nicotine dependence, cigarettes, uncomplicated: Secondary | ICD-10-CM

## 2016-01-27 ENCOUNTER — Telehealth: Payer: Self-pay | Admitting: Acute Care

## 2016-01-27 DIAGNOSIS — F17210 Nicotine dependence, cigarettes, uncomplicated: Secondary | ICD-10-CM

## 2016-01-27 NOTE — Telephone Encounter (Signed)
I have called Mrs. Kathleen Navarro with the results of her low-dose screening CT. I explained that her scan was read as a lung RADS 2, indicating nodules that are benign in appearance or behavior. Radiology recommendation is for repeat annual lung cancer screening in November 2018 which we will schedule and order at that time. Additional findings were discussed with the patient. Including aortic atherosclerosis and mild centrilobular emphysema. I explained to her that this was a non-gated exam and degree or severity of aortic atherosclerosis could not be determined. I explained that I would fax the results to her primary care physician Dr. Velna Hatchet so that he could follow-up as he felt was clinically indicated as he knows her health history well. She verbalized understanding of the above and had no further questions at completion of the call.

## 2016-03-08 ENCOUNTER — Encounter (HOSPITAL_COMMUNITY): Payer: Self-pay | Admitting: *Deleted

## 2016-03-08 ENCOUNTER — Emergency Department (HOSPITAL_COMMUNITY): Payer: Medicare Other

## 2016-03-08 ENCOUNTER — Emergency Department (HOSPITAL_COMMUNITY)
Admission: EM | Admit: 2016-03-08 | Discharge: 2016-03-08 | Disposition: A | Discharge status: Home / Self Care | Payer: Medicare Other | Attending: Emergency Medicine | Admitting: Emergency Medicine

## 2016-03-08 DIAGNOSIS — L03114 Cellulitis of left upper limb: Secondary | ICD-10-CM

## 2016-03-08 DIAGNOSIS — I891 Lymphangitis: Secondary | ICD-10-CM

## 2016-03-08 DIAGNOSIS — M79642 Pain in left hand: Secondary | ICD-10-CM | POA: Diagnosis present

## 2016-03-08 DIAGNOSIS — F17210 Nicotine dependence, cigarettes, uncomplicated: Secondary | ICD-10-CM | POA: Diagnosis not present

## 2016-03-08 LAB — COMPREHENSIVE METABOLIC PANEL
ALBUMIN: 3.6 g/dL (ref 3.5–5.0)
ALK PHOS: 68 U/L (ref 38–126)
ALT: 12 U/L — ABNORMAL LOW (ref 14–54)
ANION GAP: 10 (ref 5–15)
AST: 17 U/L (ref 15–41)
BUN: 14 mg/dL (ref 6–20)
CALCIUM: 9.2 mg/dL (ref 8.9–10.3)
CO2: 25 mmol/L (ref 22–32)
Chloride: 105 mmol/L (ref 101–111)
Creatinine, Ser: 0.92 mg/dL (ref 0.44–1.00)
GFR calc non Af Amer: >60 mL/min (ref >60)
GLUCOSE: 97 mg/dL (ref 65–99)
POTASSIUM: 3.9 mmol/L (ref 3.5–5.1)
SODIUM: 140 mmol/L (ref 135–145)
TOTAL PROTEIN: 7 g/dL (ref 6.5–8.1)
Total Bilirubin: 0.3 mg/dL (ref 0.3–1.2)

## 2016-03-08 LAB — I-STAT CG4 LACTIC ACID, ED
LACTIC ACID, VENOUS: 0.78 mmol/L (ref 0.5–1.9)
LACTIC ACID, VENOUS: 1.07 mmol/L (ref 0.5–1.9)

## 2016-03-08 LAB — CBC
HEMATOCRIT: 43.5 % (ref 36.0–46.0)
HEMOGLOBIN: 15.4 g/dL — AB (ref 12.0–15.0)
MCH: 31.8 pg (ref 26.0–34.0)
MCHC: 35.4 g/dL (ref 30.0–36.0)
MCV: 89.7 fL (ref 78.0–100.0)
Platelets: 260 10*3/uL (ref 150–400)
RBC: 4.85 MIL/uL (ref 3.87–5.11)
RDW: 13 % (ref 11.5–15.5)
WBC: 11.6 10*3/uL — ABNORMAL HIGH (ref 4.0–10.5)

## 2016-03-08 MED ORDER — HYDROCODONE-ACETAMINOPHEN 5-325 MG PO TABS: ORAL_TABLET | ORAL | 0 refills | Status: DC

## 2016-03-08 MED ORDER — CLINDAMYCIN PHOSPHATE 600 MG/50ML IV SOLN
600.0000 mg | Freq: Once | INTRAVENOUS | Status: AC
  Administered 2016-03-08: 600 mg via INTRAVENOUS
  Filled 2016-03-08: qty 50

## 2016-03-08 MED ORDER — CLINDAMYCIN HCL 150 MG PO CAPS: 300.0000 mg | ORAL_CAPSULE | Freq: Four times a day (QID) | ORAL | 0 refills | Status: DC

## 2016-03-08 MED ORDER — MORPHINE SULFATE (PF) 4 MG/ML IV SOLN
4.0000 mg | Freq: Once | INTRAVENOUS | Status: AC
  Administered 2016-03-08: 4 mg via INTRAVENOUS
  Filled 2016-03-08: qty 1

## 2016-03-08 MED ORDER — KETOROLAC TROMETHAMINE 15 MG/ML IJ SOLN
15.0000 mg | Freq: Once | INTRAMUSCULAR | Status: AC
  Administered 2016-03-08: 15 mg via INTRAVENOUS
  Filled 2016-03-08: qty 1

## 2016-03-08 NOTE — Discharge Instructions (Signed)
Please read and follow all provided instructions.  Your diagnoses today include:  1. Cellulitis of left hand   2. Lymphangitis     Tests performed today include:  Vital signs. See below for your results today.   Medications prescribed:   Clindamycin - antibiotic  You have been prescribed an antibiotic medicine: take the entire course of medicine even if you are feeling better. Stopping early can cause the antibiotic not to work.   Vicodin (hydrocodone/acetaminophen) - narcotic pain medication  DO NOT drive or perform any activities that require you to be awake and alert because this medicine can make you drowsy. BE VERY CAREFUL not to take multiple medicines containing Tylenol (also called acetaminophen). Doing so can lead to an overdose which can damage your liver and cause liver failure and possibly death.  Take any prescribed medications only as directed.   Home care instructions:  Follow any educational materials contained in this packet. Keep affected area above the level of your heart when possible. Wash area gently twice a day with warm soapy water. Do not apply alcohol or hydrogen peroxide. Cover the area if it draining or weeping.   Follow-up instructions: See your doctor tomorrow for a recheck of your hand infection.   Return instructions:  Return to the Emergency Department if you have:  Fever  Worsening symptoms  Worsening pain  Worsening swelling  Redness of the skin that moves away from the affected area, especially if it streaks further away from the affected area   Any other emergent concerns  Your vital signs today were: BP 120/78 (BP Location: Right Arm)    Pulse 83    Temp 98 F (36.7 C)    Resp 18    Ht 5\' 5"  (1.651 m)    Wt 70.3 kg    SpO2 97%    BMI 25.79 kg/m  If your blood pressure (BP) was elevated above 135/85 this visit, please have this repeated by your doctor within one month. --------------

## 2016-03-08 NOTE — ED Triage Notes (Signed)
The pt has a red painful red lt hand that started on  Saturday  She cannot remember a bite or blister.  Redness surrounding  A lesion  And the redness goes up the lt hand

## 2016-03-08 NOTE — ED Provider Notes (Signed)
[facility] DEPT Provider Note   CSN: [id number] Arrival date & time: 03/08/16  1649     History   Chief Complaint Chief Complaint  Patient presents with  . Hand Pain    HPI Kathleen Navarro is a 56 y.o. female.  Patient presents from urgent care with 3 days of a left hand infection. Patient developed a small area of irritation on the dorsum of her left hand which she states was like a "mosquito bite". Yesterday the area became more red and swollen and today she developed streaking up her hand into her forearm. Area is extremely tender. No fevers, vomiting. Pain is worse with palpation and movement. No history of diabetes or immunocompromise. No treatments prior to arrival. No numbness or tingling. Onset of symptoms acute. Course is worsening. Nothing makes symptoms better.      Past Medical History:  Diagnosis Date  . HLD (hyperlipidemia)   . Ruptured disc, cervical    C4-5, C5-6    Patient Active Problem List   Diagnosis Date Noted  . S/P cervical spinal fusion 07/04/2013  . Overweight (BMI 25.0-29.9) 09/13/2011  . Shoulder pain, right 09/13/2011  . SINUSITIS, CHRONIC 09/09/2009  . OTITIS EXTERNA, ACUTE 03/03/2009  . CHEST PAIN, HX OF 10/26/2008  . Enlargement of lymph nodes 01/09/2008  . ANEMIA, ACUTE BLOOD LOSS 05/05/2006  . TOBACCO DEPENDENCE 05/05/2006  . GASTROESOPHAGEAL REFLUX, NO ESOPHAGITIS 05/05/2006    Past Surgical History:  Procedure Laterality Date  . ABDOMINAL HYSTERECTOMY    . SPINE SURGERY      OB History    No data available       Home Medications    Prior to Admission medications   Medication Sig Start Date End Date Taking? Authorizing Provider  pregabalin (LYRICA) 75 MG capsule Take 75 mg by mouth at bedtime.    Historical Provider, MD    Family History Family History  Problem Relation Age of Onset  . Lung cancer Mother   . Stroke Mother   . Hypertension Mother   . Heart disease Father   . Hypertension Sister      Social History Social History  Substance Use Topics  . Smoking status: Current Every Day Smoker    Packs/day: 1.50    Years: 29.00    Types: Cigarettes  . Smokeless tobacco: Never Used  . Alcohol use No     Allergies   Macrodantin [nitrofurantoin macrocrystal]   Review of Systems Review of Systems  Constitutional: Negative for fever.  HENT: Negative for rhinorrhea and sore throat.   Eyes: Negative for redness.  Respiratory: Negative for cough.   Cardiovascular: Negative for chest pain.  Gastrointestinal: Negative for abdominal pain, diarrhea, nausea and vomiting.  Genitourinary: Negative for dysuria.  Musculoskeletal: Negative for myalgias.  Skin: Positive for color change and wound. Negative for rash.       There is a small 5 mm circular blister noted on the dorsum of the left hand proximal to the fourth MCP joint. Patient has erythema on the ulnar aspect of the dorsum of the hand from the long finger to the little finger. Patient has light colored lymphangitis noted on the distal forearm extending up towards the elbow. Generalized tenderness.   Neurological: Negative for headaches.     Physical Exam Updated Vital Signs BP 137/89   Pulse 86   Temp 98 F (36.7 C)   Resp 16   Ht 5\' 5"  (1.651 m)   Wt 70.3 kg   SpO2 99%  BMI 25.79 kg/m   Physical Exam  Constitutional: She appears well-developed and well-nourished.  HENT:  Head: Normocephalic and atraumatic.  Eyes: Conjunctivae are normal. Right eye exhibits no discharge. Left eye exhibits no discharge.  Neck: Normal range of motion. Neck supple.  Cardiovascular: Normal rate, regular rhythm and normal heart sounds.   Pulses:      Radial pulses are 2+ on the right side, and 2+ on the left side.  Pulmonary/Chest: Effort normal and breath sounds normal.  Abdominal: Soft. There is no tenderness.  Musculoskeletal:  Patient has difficulty making a fist with her left hand but states that this is very painful to  try. She is able to flex and extend, bend her wrist laterally well.   Neurological: She is alert.  Skin: Skin is warm and dry.  There is a small 5 mm circular blister noted on the dorsum of the left hand proximal to the fourth MCP joint. Patient has erythema on the ulnar aspect of the dorsum of the hand from the long finger to the little finger. Patient has light colored lymphangitis noted on the distal forearm extending up towards the elbow. Generalized tenderness.   Psychiatric: She has a normal mood and affect.  Nursing note and vitals reviewed.       ED Treatments / Results  Labs (all labs ordered are listed, but only abnormal results are displayed) Labs Reviewed  COMPREHENSIVE METABOLIC PANEL - Abnormal; Notable for the following:       Result Value   ALT 12 (*)    All other components within normal limits  CBC - Abnormal; Notable for the following:    WBC 11.6 (*)    Hemoglobin 15.4 (*)    All other components within normal limits  I-STAT CG4 LACTIC ACID, ED  I-STAT CG4 LACTIC ACID, ED    EKG  EKG Interpretation None       Radiology Dg Hand Complete Left  Result Date: 03/08/2016 CLINICAL DATA:  Soft tissue lesion of the dorsal hand in the metacarpal region. EXAM: LEFT HAND - COMPLETE 3+ VIEW COMPARISON:  None. FINDINGS: No evidence of fracture, dislocation or degenerative change. No radiopaque foreign object. No definable soft tissue abnormality by radiography. IMPRESSION: Negative. Electronically Signed   By: Nelson Chimes M.D.   On: 03/08/2016 19:26    Procedures Procedures (including critical care time)  Medications Ordered in ED Medications  clindamycin (CLEOCIN) IVPB 600 mg (not administered)  ketorolac (TORADOL) 15 MG/ML injection 15 mg (not administered)      Initial Impression / Assessment and Plan / ED Course  I have reviewed the triage vital signs and the nursing notes.  Pertinent labs & imaging results that were available during my care of the  patient were reviewed by me and considered in my medical decision making (see chart for details).  Clinical Course    Patient seen and examined. Lymphangitis has spread several centimeters since ED arrival. Medications ordered.   Vital signs reviewed and are as follows: BP 137/89   Pulse 86   Temp 98 F (36.7 C)   Resp 16   Ht 5\' 5"  (1.651 m)   Wt 70.3 kg   SpO2 99%   BMI 25.79 kg/m   10:34 PM Bedside US performed with Dr. Darl Householder. No drainable fluid collection identified. Cellulitis identified.   EMERGENCY DEPARTMENT US SOFT TISSUE INTERPRETATION "Study: Limited Ultrasound of the noted body part in comments below"  INDICATIONS: Pain and Soft tissue infection Multiple views of  the body part are obtained with a multi-frequency linear probe  PERFORMED BY:  Myself  IMAGES ARCHIVED?: Yes  SIDE:Left  BODY PART:Upper extremity  FINDINGS: Cellulitis present  LIMITATIONS:  none  INTERPRETATION:  No abcess noted and Cellulitis present  COMMENT:    11:26 PM Lymphangitis is stable. Patient has received IV antibiotics. She has reliable follow-up with [facility]. She is instructed to call tomorrow morning for an appointment for a recheck tomorrow.  Pt urged to return with worsening pain, worsening swelling, expanding area of redness or streaking up extremity, fever, or any other concerns. Urged to take complete course of antibiotics as prescribed. Counseled to take pain medications as prescribed. Pt verbalizes understanding and agrees with plan.    Final Clinical Impressions(s) / ED Diagnoses   Final diagnoses:  Cellulitis of left hand  Lymphangitis   Patient with cellulitis of hand with associated lymphangitis. Her symptoms are stable after IV antibiotics in emergency department. Lab work is reassuring. No fever. Patient appears well, nontoxic. No diabetes or other immune compromise. No abscess noted on exam with ultrasound. Do not feel that emergent orthopedic  consultation is indicated at this time.  New Prescriptions New Prescriptions   CLINDAMYCIN (CLEOCIN) 150 MG CAPSULE    Take 2 capsules (300 mg total) by mouth every 6 (six) hours.   HYDROCODONE-ACETAMINOPHEN (NORCO/VICODIN) 5-325 MG TABLET    Take 1-2 tablets every 6 hours as needed for severe pain     Carlisle Cater, PA-C 03/08/16 Mellette Yao, MD 03/09/16 [phone]

## 2016-09-09 ENCOUNTER — Other Ambulatory Visit: Payer: Self-pay | Admitting: Orthopaedic Surgery

## 2016-09-09 DIAGNOSIS — M4726 Other spondylosis with radiculopathy, lumbar region: Secondary | ICD-10-CM

## 2016-09-19 ENCOUNTER — Ambulatory Visit
Admission: RE | Admit: 2016-09-19 | Discharge: 2016-09-19 | Disposition: A | Discharge status: Home / Self Care | Payer: Medicare Other | Source: Ambulatory Visit | Attending: Orthopaedic Surgery | Admitting: Orthopaedic Surgery

## 2016-09-19 DIAGNOSIS — M4726 Other spondylosis with radiculopathy, lumbar region: Secondary | ICD-10-CM

## 2016-11-17 ENCOUNTER — Ambulatory Visit: Payer: Self-pay | Admitting: Podiatry

## 2016-12-27 ENCOUNTER — Other Ambulatory Visit: Payer: Self-pay | Admitting: Acute Care

## 2016-12-27 DIAGNOSIS — Z122 Encounter for screening for malignant neoplasm of respiratory organs: Secondary | ICD-10-CM

## 2016-12-27 DIAGNOSIS — F17210 Nicotine dependence, cigarettes, uncomplicated: Principal | ICD-10-CM

## 2017-01-26 ENCOUNTER — Encounter: Payer: Self-pay | Admitting: Acute Care

## 2017-07-15 ENCOUNTER — Other Ambulatory Visit: Payer: Self-pay | Admitting: Internal Medicine

## 2017-07-15 DIAGNOSIS — Z0000 Encounter for general adult medical examination without abnormal findings: Secondary | ICD-10-CM

## 2017-07-15 DIAGNOSIS — Z720 Tobacco use: Secondary | ICD-10-CM

## 2017-07-21 ENCOUNTER — Ambulatory Visit
Admission: RE | Admit: 2017-07-21 | Discharge: 2017-07-21 | Disposition: A | Discharge status: Home / Self Care | Payer: Medicare Other | Source: Ambulatory Visit | Attending: Internal Medicine | Admitting: Internal Medicine

## 2017-07-21 ENCOUNTER — Other Ambulatory Visit: Payer: Self-pay | Admitting: Internal Medicine

## 2017-07-21 DIAGNOSIS — Z720 Tobacco use: Secondary | ICD-10-CM

## 2017-07-21 DIAGNOSIS — Z1231 Encounter for screening mammogram for malignant neoplasm of breast: Secondary | ICD-10-CM

## 2017-07-21 DIAGNOSIS — Z0000 Encounter for general adult medical examination without abnormal findings: Secondary | ICD-10-CM

## 2017-07-22 ENCOUNTER — Other Ambulatory Visit (HOSPITAL_COMMUNITY): Payer: Self-pay | Admitting: Internal Medicine

## 2017-07-26 ENCOUNTER — Other Ambulatory Visit (HOSPITAL_COMMUNITY): Payer: Self-pay | Admitting: Internal Medicine

## 2017-07-26 DIAGNOSIS — R911 Solitary pulmonary nodule: Secondary | ICD-10-CM

## 2017-07-27 ENCOUNTER — Other Ambulatory Visit (HOSPITAL_COMMUNITY): Payer: Self-pay | Admitting: Internal Medicine

## 2017-07-27 DIAGNOSIS — R918 Other nonspecific abnormal finding of lung field: Secondary | ICD-10-CM

## 2017-07-27 DIAGNOSIS — R911 Solitary pulmonary nodule: Secondary | ICD-10-CM

## 2017-08-03 ENCOUNTER — Ambulatory Visit (HOSPITAL_COMMUNITY)
Admission: RE | Admit: 2017-08-03 | Discharge: 2017-08-03 | Disposition: A | Discharge status: Home / Self Care | Payer: Medicare Other | Source: Ambulatory Visit | Attending: Internal Medicine | Admitting: Internal Medicine

## 2017-08-03 DIAGNOSIS — D3502 Benign neoplasm of left adrenal gland: Secondary | ICD-10-CM | POA: Insufficient documentation

## 2017-08-03 DIAGNOSIS — I700 Atherosclerosis of aorta: Secondary | ICD-10-CM | POA: Diagnosis not present

## 2017-08-03 DIAGNOSIS — R911 Solitary pulmonary nodule: Secondary | ICD-10-CM | POA: Diagnosis present

## 2017-08-03 DIAGNOSIS — K7689 Other specified diseases of liver: Secondary | ICD-10-CM | POA: Insufficient documentation

## 2017-08-03 DIAGNOSIS — R918 Other nonspecific abnormal finding of lung field: Secondary | ICD-10-CM | POA: Insufficient documentation

## 2017-08-03 DIAGNOSIS — J432 Centrilobular emphysema: Secondary | ICD-10-CM | POA: Diagnosis not present

## 2017-08-03 LAB — GLUCOSE, CAPILLARY: GLUCOSE-CAPILLARY: 104 mg/dL — AB (ref 65–99)

## 2017-08-03 MED ORDER — FLUDEOXYGLUCOSE F - 18 (FDG) INJECTION
7.7700 | Freq: Once | INTRAVENOUS | Status: AC | PRN
  Administered 2017-08-03: 7.77 via INTRAVENOUS

## 2017-08-11 ENCOUNTER — Ambulatory Visit
Admission: RE | Admit: 2017-08-11 | Discharge: 2017-08-11 | Disposition: A | Discharge status: Home / Self Care | Payer: Medicare Other | Source: Ambulatory Visit | Attending: Internal Medicine | Admitting: Internal Medicine

## 2017-08-11 DIAGNOSIS — Z1231 Encounter for screening mammogram for malignant neoplasm of breast: Secondary | ICD-10-CM

## 2019-02-01 IMAGING — PT NM PET TUM IMG INITIAL (PI) SKULL BASE T - THIGH
6 of 9 series · 17 of 25 positions shown · non-contrast
Comparison: Multiple exams, including Chest CT from 07/21/2017

CLINICAL DATA: Initial treatment strategy for lung nodule.

EXAM:
NUCLEAR MEDICINE PET SKULL BASE TO THIGH
TECHNIQUE: 7.8 mCi F-18 FDG was injected intravenously. Full-ring PET imaging
was performed from the skull base to thigh after the radiotracer. CT
data was obtained and used for attenuation correction and anatomic
localization.
Fasting blood glucose: 104 mg/dl

[Series 3: pet sk_thigh ac · axial · 5.0mm · 4.07mm/px · z∈[-1462,-594]mm · 3 of 218 slices shown]
[im 1/218]
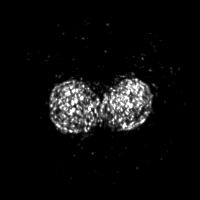
[im 145/218]
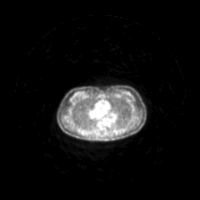
[im 218/218]
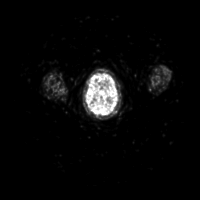

[Series 4: ct sk_thigh 5.0 b31f · axial · 5.0mm · 0.98mm/px · z∈[-1246,-594]mm · 3 of 217 slices shown]
[im 55/217]
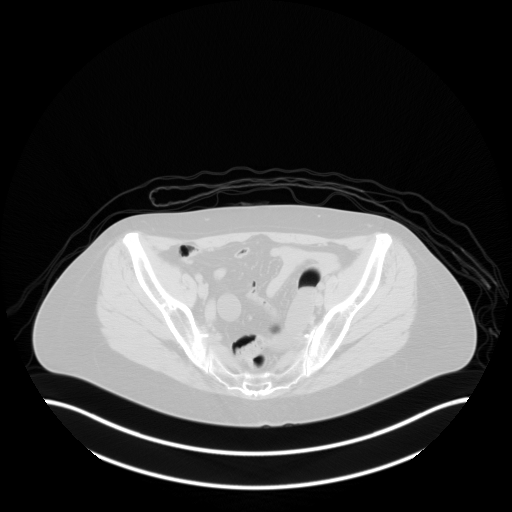
[im 109/217]
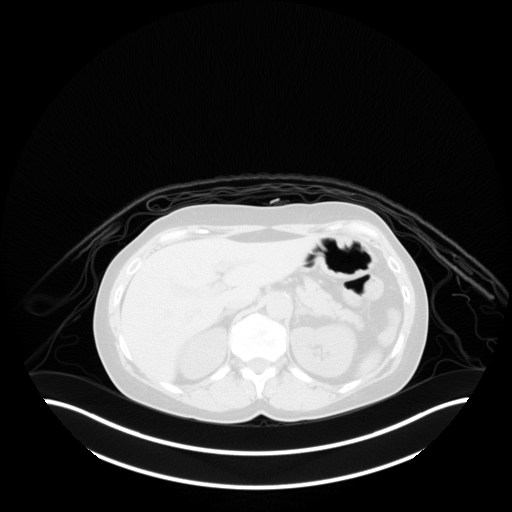
[im 217/217  brain]
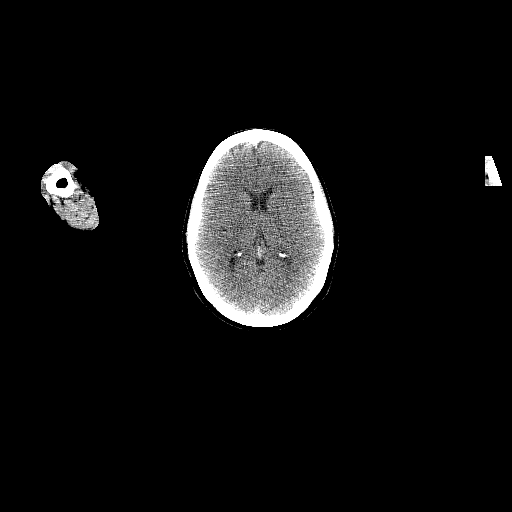

[Series 5: pet sk_thigh nac · axial · 5.0mm · 4.07mm/px · z∈[-1462,-594]mm · 4 of 218 slices shown]
[im 1/218]
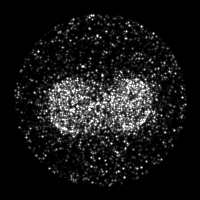
[im 109/218]
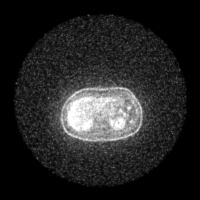
[im 163/218]
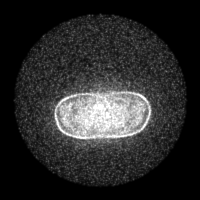
[im 218/218]
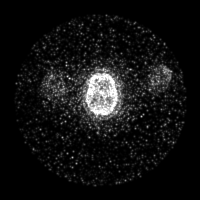

[Series 603: range-ct sk_thigh 5.0 (id)<alpha range> · 2 of 87 slices shown (1 of 2)]
[im 1/87]
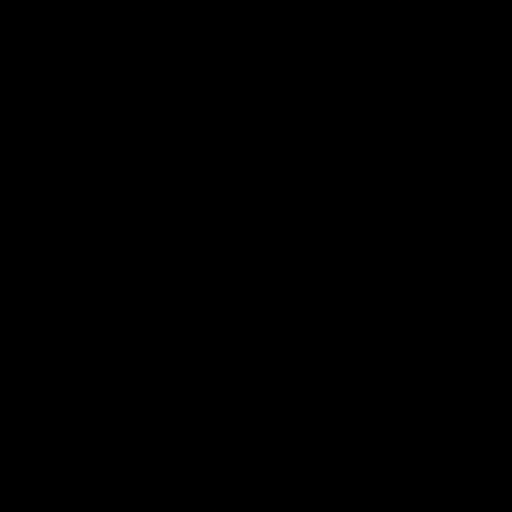
[im 87/87]
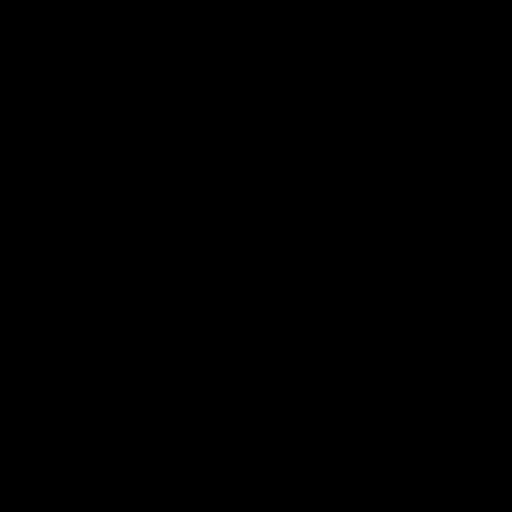

[Series 605: range-ct sk_thigh 5.0 (id)<alpha range> · 4 of 211 slices shown (2 of 2)]
[im 1/211]
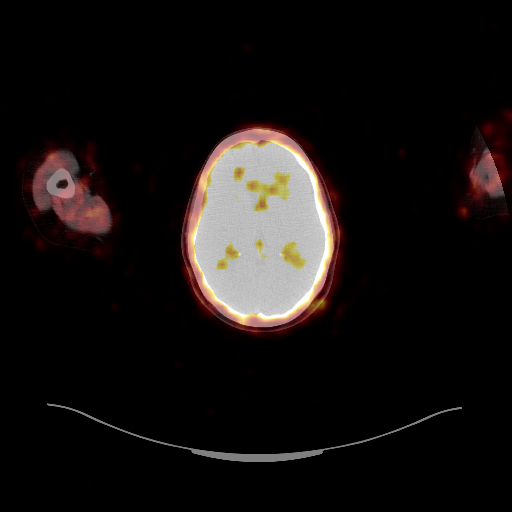
[im 53/211]
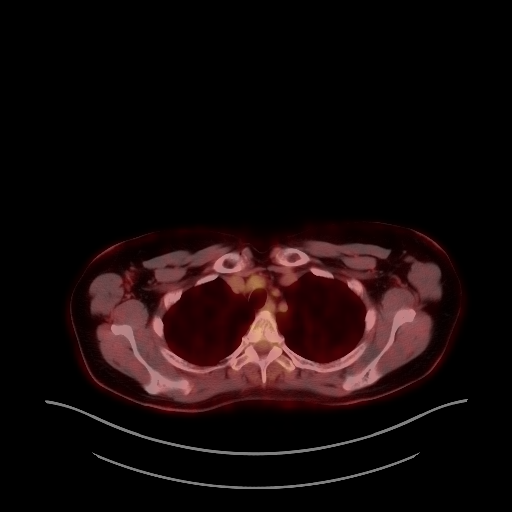
[im 158/211]
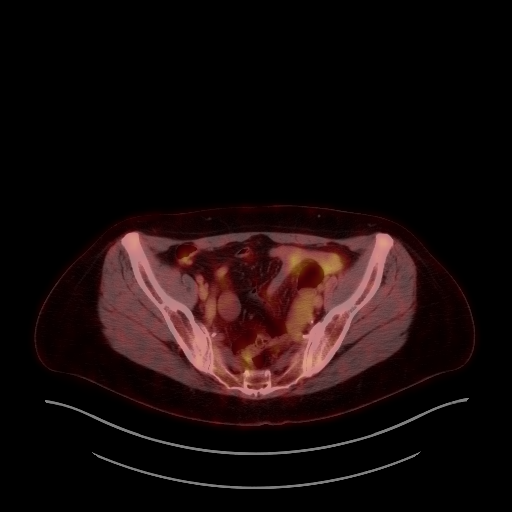
[im 211/211]
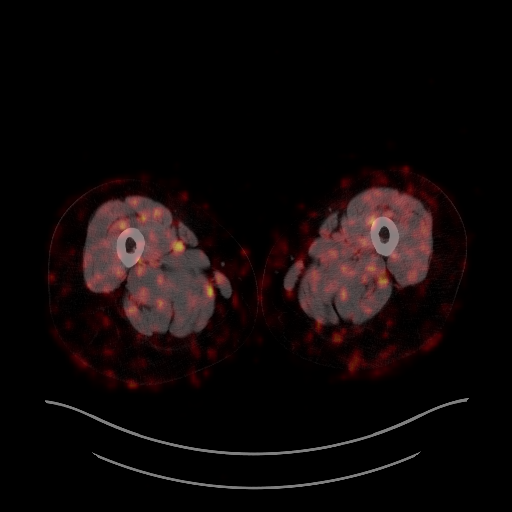

[Series 1214: results mm oncology reading · 1.0mm · 0.40mm/px · 1 of 2 slices shown]
[im 1/2]
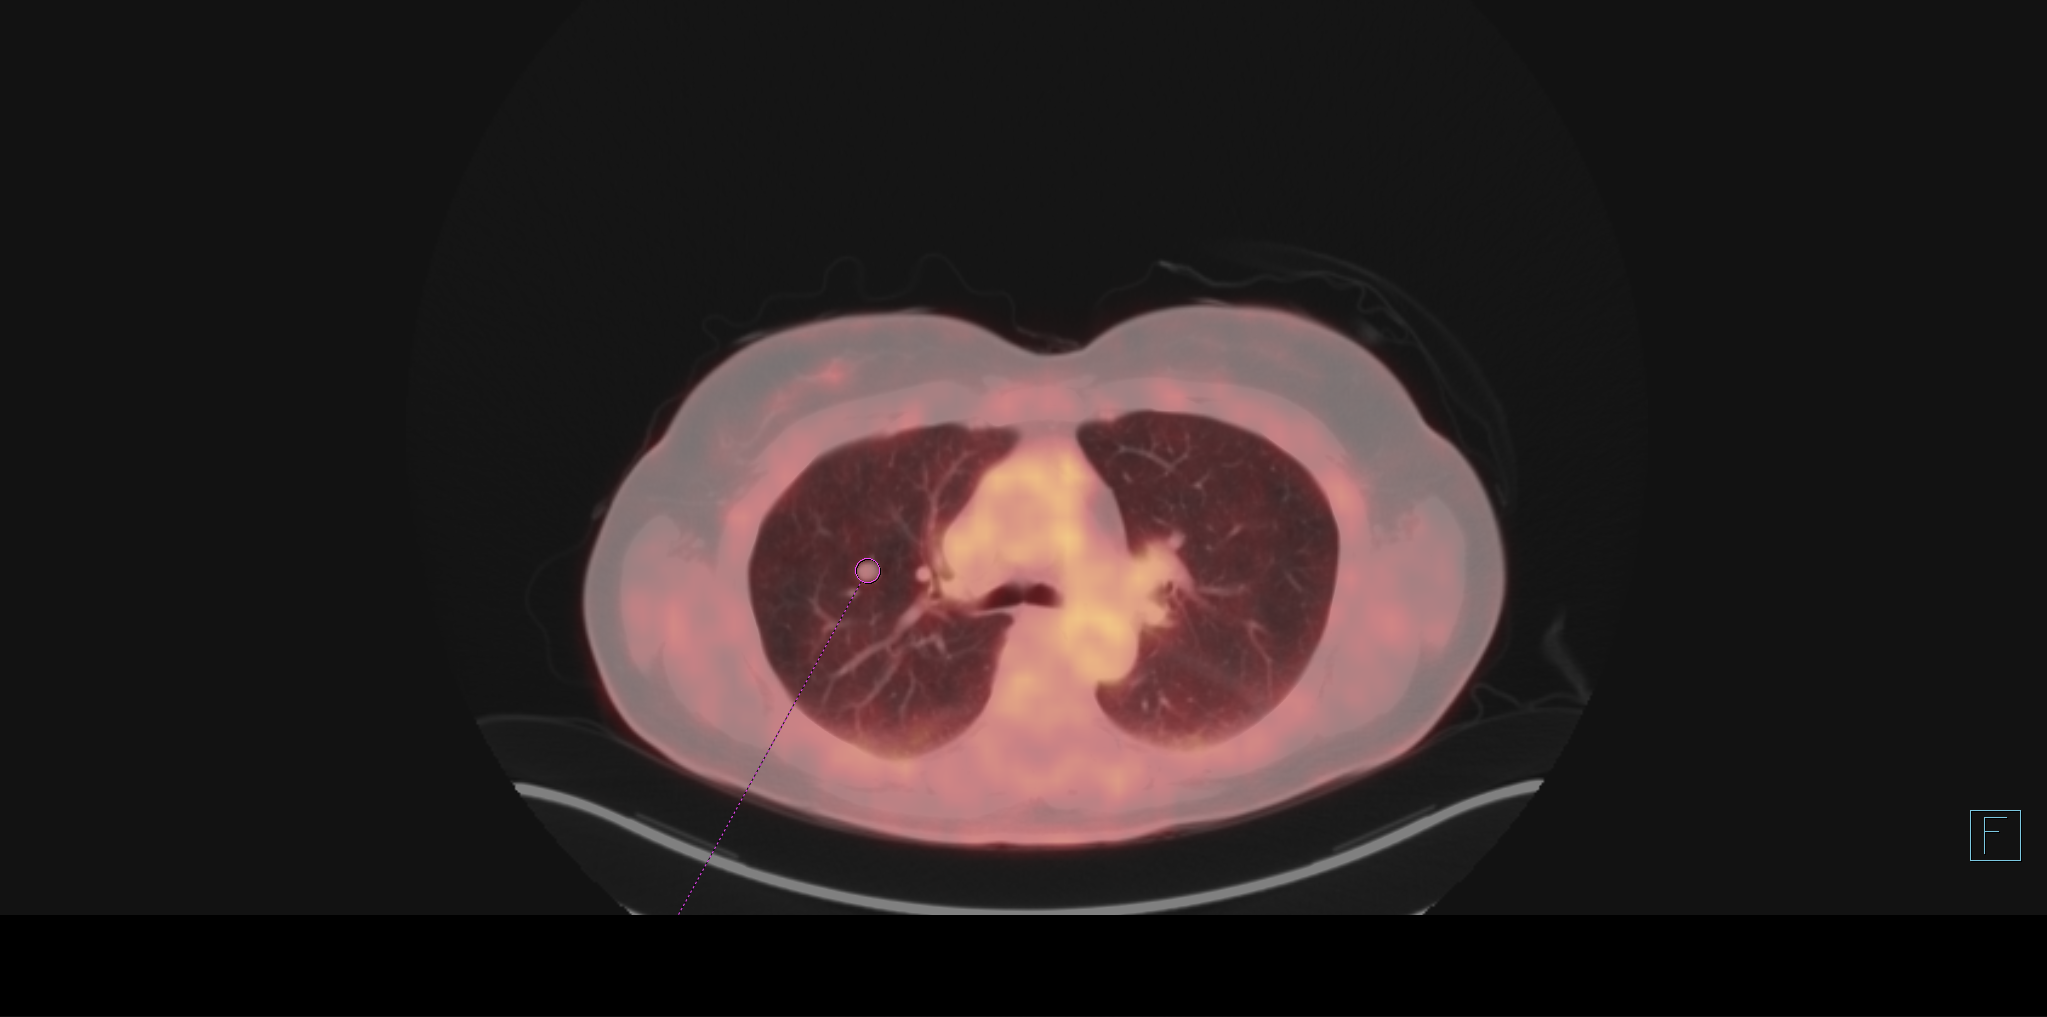

[17 of 25 positions shown; findings below may reference images not displayed]

FINDINGS: Mediastinal blood pool activity: SUV max

NECK: Accentuated activity along the posterior upper nasopharynx in
the expected vicinity of the adenoids, maximum SUV 6.6. There is
also accentuated activity bilaterally in the palatine tonsils,
maximum SUV 7.0 on the right and 6.8 on the left. No hypermetabolic
adenopathy in the neck.

Incidental CT findings: none

CHEST: The rounded right upper lobe nodule measures 1.0 by 0.8 cm on
image [DATE] and has a maximum SUV of 1.1.

The 1.4 by 0.9 cm left upper lobe nodule adjacent to the descending
thoracic aorta has a maximum SUV of 1.8. No enlarged or
hypermetabolic lymph nodes in the chest.

Incidental CT findings: Mild atherosclerotic calcification of the
aortic arch. Mildly ectatic ascending thoracic aorta without
aneurysm. Centrilobular emphysema.

ABDOMEN/PELVIS: No significant abnormal hypermetabolic activity in
this region.

Incidental CT findings: Nonspecific 0.7 by 0.5 cm lesion in the left
hepatic lobe on image 105/4, not hypermetabolic.

1.3 by 1.7 cm left adrenal mass has a density of 2 Hounsfield units
compatible with adrenal adenoma.

Aortoiliac atherosclerotic vascular disease.

SKELETON: No significant abnormal hypermetabolic activity in this
region.

Incidental CT findings: none
IMPRESSION: 1. The rounded right upper lobe nodule has a maximum SUV of 1.1 and
the left upper lobe nodule has a maximum SUV of 1.8. However, the
right upper lobe is very clearly enlarging when I compare 07/10/2014
to 07/21/2017. The growth indicates that this lesion could still be
a low-grade adenocarcinoma despite the low SUV. Possibilities for
further assessment might include surveillance CT imaging, biopsy
(although percutaneous biopsy would carry significant risk of
pneumothorax given the emphysema), or surgery.
2. In contrast the left upper lobe nodule has remained stable from
9897.
3. No findings of adenopathy or distant hypermetabolic lesion.
4. Moderately hypermetabolic activity in both palatine tonsils in a
symmetric fashion, and also along the posterior upper nasopharynx in
the expected vicinity of the adenoids. The posterior nasopharyngeal
activity could be from non-involuted adenoidal activity, or
conceivably an actual lesion along the posterior upper nasopharynx.
Consider otolaryngology referral for further assessment.
5. Other imaging findings of potential clinical significance: Aortic
Atherosclerosis (G7UKN-ZTP.P) and Emphysema (G7UKN-KC9.V). Left
adrenal adenoma. Small hypodense lesion in the left hepatic lobe is
stable and not hypermetabolic.

## 2019-07-27 ENCOUNTER — Other Ambulatory Visit: Payer: Self-pay | Admitting: Internal Medicine

## 2019-07-27 DIAGNOSIS — Z1231 Encounter for screening mammogram for malignant neoplasm of breast: Secondary | ICD-10-CM

## 2019-07-31 ENCOUNTER — Other Ambulatory Visit: Payer: Self-pay

## 2019-07-31 ENCOUNTER — Ambulatory Visit
Admission: RE | Admit: 2019-07-31 | Discharge: 2019-07-31 | Disposition: A | Discharge status: Home / Self Care | Payer: Medicare Other | Source: Ambulatory Visit | Attending: Internal Medicine | Admitting: Internal Medicine

## 2019-07-31 DIAGNOSIS — Z1231 Encounter for screening mammogram for malignant neoplasm of breast: Secondary | ICD-10-CM

## 2020-07-03 ENCOUNTER — Other Ambulatory Visit: Payer: Self-pay | Admitting: Internal Medicine

## 2020-07-03 DIAGNOSIS — Z1231 Encounter for screening mammogram for malignant neoplasm of breast: Secondary | ICD-10-CM

## 2020-08-25 ENCOUNTER — Ambulatory Visit
Admission: RE | Admit: 2020-08-25 | Discharge: 2020-08-25 | Disposition: A | Discharge status: Home / Self Care | Payer: Medicare Other | Source: Ambulatory Visit | Attending: Internal Medicine | Admitting: Internal Medicine

## 2020-08-25 ENCOUNTER — Other Ambulatory Visit: Payer: Self-pay

## 2020-08-25 DIAGNOSIS — Z1231 Encounter for screening mammogram for malignant neoplasm of breast: Secondary | ICD-10-CM

## 2021-02-09 ENCOUNTER — Encounter (HOSPITAL_COMMUNITY): Payer: Self-pay

## 2021-02-09 ENCOUNTER — Other Ambulatory Visit: Payer: Self-pay

## 2021-02-09 ENCOUNTER — Ambulatory Visit (HOSPITAL_COMMUNITY)
Admission: EM | Admit: 2021-02-09 | Discharge: 2021-02-09 | Disposition: A | Discharge status: Home / Self Care | Payer: Medicare Other | Attending: Emergency Medicine | Admitting: Emergency Medicine

## 2021-02-09 DIAGNOSIS — J069 Acute upper respiratory infection, unspecified: Secondary | ICD-10-CM | POA: Diagnosis present

## 2021-02-09 DIAGNOSIS — F17210 Nicotine dependence, cigarettes, uncomplicated: Secondary | ICD-10-CM | POA: Insufficient documentation

## 2021-02-09 DIAGNOSIS — Z20822 Contact with and (suspected) exposure to covid-19: Secondary | ICD-10-CM | POA: Insufficient documentation

## 2021-02-09 LAB — POC INFLUENZA A AND B ANTIGEN (URGENT CARE ONLY)
INFLUENZA A ANTIGEN, POC: NEGATIVE
INFLUENZA B ANTIGEN, POC: NEGATIVE

## 2021-02-09 MED ORDER — AMOXICILLIN-POT CLAVULANATE 875-125 MG PO TABS: 1.0000 | ORAL_TABLET | Freq: Two times a day (BID) | ORAL | 0 refills | Status: DC

## 2021-02-09 MED ORDER — CETIRIZINE HCL 10 MG PO TABS: 10.0000 mg | ORAL_TABLET | Freq: Every day | ORAL | 0 refills | Status: DC

## 2021-02-09 NOTE — Discharge Instructions (Addendum)
We will contact you if your COVID  test is positive.  Please quarantine while you wait for the results.  If your test is negative you may resume normal activities.  If your test is positive please continue to quarantine for at least 5 days from your symptom onset or until you are without a fever for at least 24 hours after the medications.  Flu test negative    IF flu positive please remind person who contacted you that you may have tamiflu  Begin use of zyrtec daily to reduce secretions  Begin use of flonase daily to help clear secretions  If still having symptoms on day 10 of illness may attempt use of antibiotic may take Augmentin twice a day for 7 days, if positive for covid or flu do not take this medication  You can take Tylenol and/or Ibuprofen as needed for fever reduction and pain relief.   For cough: honey 1/2 to 1 teaspoon (you can dilute the honey in water or another fluid).  You can also use guaifenesin and dextromethorphan for cough. You can use a humidifier for chest congestion and cough.  If you don't have a humidifier, you can sit in the bathroom with the hot shower running.      For sore throat: try warm salt water gargles, cepacol lozenges, throat spray, warm tea or water with lemon/honey, popsicles or ice, or OTC cold relief medicine for throat discomfort.   For congestion: take a daily anti-histamine like Zyrtec, Claritin, and a oral decongestant, such as pseudoephedrine.  You can also use Flonase 1-2 sprays in each nostril daily.   It is important to stay hydrated: drink plenty of fluids (water, gatorade/powerade/pedialyte, juices, or teas) to keep your throat moisturized and help further relieve irritation/discomfort.

## 2021-02-09 NOTE — ED Provider Notes (Signed)
[facility]    CSN: [id number] Arrival date & time: 02/09/21  1812      History   Chief Complaint Chief Complaint  Patient presents with   Cough    FEVER CONGESTION     HPI Kathleen Navarro is a 60 y.o. female.   Patient presents with chills, fever, nasal congestion, ear fullness, rhinorrhea, productive cough and intermittent generalized headaches for 3 days.  Appetite tolerating fluids.  Endorses increased fatigue.  Has attempted use of [place] flu which somewhat helpful. No known sick contacts. Current cigarette smoker.   Past Medical History:  Diagnosis Date   HLD (hyperlipidemia)    Ruptured disc, cervical    C4-5, C5-6    Patient Active Problem List   Diagnosis Date Noted   S/P cervical spinal fusion 07/04/2013   Overweight (BMI 25.0-29.9) 09/13/2011   Shoulder pain, right 09/13/2011   SINUSITIS, CHRONIC 09/09/2009   OTITIS EXTERNA, ACUTE 03/03/2009   CHEST PAIN, HX OF 10/26/2008   Enlargement of lymph nodes 01/09/2008   ANEMIA, ACUTE BLOOD LOSS 05/05/2006   TOBACCO DEPENDENCE 05/05/2006   GASTROESOPHAGEAL REFLUX, NO ESOPHAGITIS 05/05/2006    Past Surgical History:  Procedure Laterality Date   ABDOMINAL HYSTERECTOMY     SPINE SURGERY      OB History   No obstetric history on file.      Home Medications    Prior to Admission medications   Medication Sig Start Date End Date Taking? Authorizing Provider  clindamycin (CLEOCIN) 150 MG capsule Take 2 capsules (300 mg total) by mouth every 6 (six) hours. 03/08/16   Carlisle Cater, PA-C  HYDROcodone-acetaminophen (NORCO/VICODIN) 5-325 MG tablet Take 1-2 tablets every 6 hours as needed for severe pain 03/08/16   Carlisle Cater, PA-C  pregabalin (LYRICA) 75 MG capsule Take 75 mg by mouth at bedtime.    [provider]    Family History Family History  Problem Relation Age of Onset   Lung cancer Mother    Stroke Mother    Hypertension Mother    Heart disease Father    Hypertension  Sister    Breast cancer Neg Hx     Social History Social History   Tobacco Use   Smoking status: Every Day    Packs/day: 1.50    Years: 29.00    Pack years: 43.50    Types: Cigarettes   Smokeless tobacco: Never  Substance Use Topics   Alcohol use: No    Alcohol/week: 0.0 standard drinks   Drug use: No     Allergies   Macrodantin [nitrofurantoin macrocrystal]   Review of Systems Review of Systems  Constitutional:  Positive for appetite change, chills, fatigue and fever. Negative for activity change, diaphoresis and unexpected weight change.  HENT:  Positive for congestion, ear pain, rhinorrhea and sore throat. Negative for dental problem, drooling, ear discharge, facial swelling, hearing loss, mouth sores, nosebleeds, postnasal drip, sinus pressure, sinus pain, sneezing, tinnitus, trouble swallowing and voice change.   Respiratory:  Positive for cough. Negative for apnea, choking, chest tightness, shortness of breath, wheezing and stridor.   Cardiovascular: Negative.   Gastrointestinal: Negative.   Skin: Negative.   Neurological:  Positive for headaches. Negative for dizziness, tremors, seizures, syncope, facial asymmetry, speech difficulty, weakness, light-headedness and numbness.    Physical Exam Triage Vital Signs ED Triage Vitals  Enc Vitals Group     BP 02/09/21 1857 132/90     Pulse Rate 02/09/21 1857 90     Resp 02/09/21  1857 18     Temp 02/09/21 1857 97.9 F (36.6 C)     Temp Source 02/09/21 1857 Oral     SpO2 02/09/21 1857 100 %     Weight --      Height --      Head Circumference --      Peak Flow --      Pain Score 02/09/21 1858 3     Pain Loc --      Pain Edu? --      Excl. in [place]? --    No data found.  Updated Vital Signs BP 132/90 (BP Location: Left Arm)   Pulse 90   Temp 97.9 F (36.6 C) (Oral)   Resp 18   SpO2 100%   Visual Acuity Right Eye Distance:   Left Eye Distance:   Bilateral Distance:    Right Eye Near:   Left Eye Near:     Bilateral Near:     Physical Exam Constitutional:      Appearance: Normal appearance. She is normal weight.  HENT:     Head: Normocephalic.     Right Ear: Tympanic membrane, ear canal and external ear normal.     Left Ear: Tympanic membrane, ear canal and external ear normal.     Nose: Congestion and rhinorrhea present.     Mouth/Throat:     Mouth: Mucous membranes are moist.     Pharynx: Posterior oropharyngeal erythema present.  Eyes:     Extraocular Movements: Extraocular movements intact.  Cardiovascular:     Rate and Rhythm: Normal rate and regular rhythm.     Pulses: Normal pulses.     Heart sounds: Normal heart sounds.  Pulmonary:     Effort: Pulmonary effort is normal.     Breath sounds: Normal breath sounds.  Musculoskeletal:     Cervical back: Normal range of motion and neck supple.  Skin:    General: Skin is warm and dry.  Neurological:     Mental Status: She is alert and oriented to person, place, and time. Mental status is at baseline.  Psychiatric:        Mood and Affect: Mood normal.        Behavior: Behavior normal.     UC Treatments / Results  Labs (all labs ordered are listed, but only abnormal results are displayed) Labs Reviewed - No data to display  EKG   Radiology No results found.  Procedures Procedures (including critical care time)  Medications Ordered in UC Medications - No data to display  Initial Impression / Assessment and Plan / UC Course  I have reviewed the triage vital signs and the nursing notes.  Pertinent labs & imaging results that were available during my care of the patient were reviewed by me and considered in my medical decision making (see chart for details).  Viral URI with cough  1.Flu test negative, COVID test pending 2.  Zyrtec 10 mg daily 3.  Over-the-counter medications for remaining symptom management 4. Patient endorses that she gets frequent sinus infections, she will have antibiotic placed at pharmacy  on day 10 of illness, prescribed Augmentin 875-125 twice daily for 5 days 5.  Urgent care follow-up as Final Clinical Impressions(s) / UC Diagnoses   Final diagnoses:  None   Discharge Instructions   None    ED Prescriptions   None    PDMP not reviewed this encounter.   Hans [place], [place] 02/10/21 [phone]

## 2021-02-09 NOTE — ED Triage Notes (Signed)
Pt present to the office today for flu-like symptoms with cough.

## 2021-02-10 LAB — SARS CORONAVIRUS 2 (TAT 6-24 HRS): SARS Coronavirus 2: NEGATIVE

## 2021-08-20 ENCOUNTER — Other Ambulatory Visit: Payer: Self-pay | Admitting: Internal Medicine

## 2021-08-20 DIAGNOSIS — F17200 Nicotine dependence, unspecified, uncomplicated: Secondary | ICD-10-CM

## 2021-08-27 ENCOUNTER — Other Ambulatory Visit: Payer: Self-pay | Admitting: Internal Medicine

## 2021-08-27 DIAGNOSIS — Z1231 Encounter for screening mammogram for malignant neoplasm of breast: Secondary | ICD-10-CM

## 2021-09-11 ENCOUNTER — Ambulatory Visit
Admission: RE | Admit: 2021-09-11 | Discharge: 2021-09-11 | Disposition: A | Discharge status: Home / Self Care | Payer: Medicare Other | Source: Ambulatory Visit | Attending: Internal Medicine | Admitting: Internal Medicine

## 2021-09-11 DIAGNOSIS — Z1231 Encounter for screening mammogram for malignant neoplasm of breast: Secondary | ICD-10-CM

## 2021-09-16 ENCOUNTER — Ambulatory Visit
Admission: RE | Admit: 2021-09-16 | Discharge: 2021-09-16 | Disposition: A | Discharge status: Home / Self Care | Payer: Medicare Other | Source: Ambulatory Visit | Attending: Internal Medicine | Admitting: Internal Medicine

## 2021-09-16 DIAGNOSIS — F17200 Nicotine dependence, unspecified, uncomplicated: Secondary | ICD-10-CM

## 2021-09-25 ENCOUNTER — Other Ambulatory Visit (HOSPITAL_COMMUNITY): Payer: Self-pay | Admitting: Internal Medicine

## 2021-09-25 ENCOUNTER — Other Ambulatory Visit: Payer: Self-pay | Admitting: Internal Medicine

## 2021-09-25 DIAGNOSIS — R911 Solitary pulmonary nodule: Secondary | ICD-10-CM

## 2021-10-01 ENCOUNTER — Encounter (HOSPITAL_COMMUNITY)
Admission: RE | Admit: 2021-10-01 | Discharge: 2021-10-01 | Disposition: A | Discharge status: Home / Self Care | Payer: Medicare Other | Source: Ambulatory Visit | Attending: Internal Medicine | Admitting: Internal Medicine

## 2021-10-01 DIAGNOSIS — R911 Solitary pulmonary nodule: Secondary | ICD-10-CM | POA: Diagnosis present

## 2021-10-01 LAB — GLUCOSE, CAPILLARY: Glucose-Capillary: 98 mg/dL (ref 70–99)

## 2021-10-01 MED ORDER — FLUDEOXYGLUCOSE F - 18 (FDG) INJECTION
7.5000 | Freq: Once | INTRAVENOUS | Status: AC | PRN
  Administered 2021-10-01: 7.5 via INTRAVENOUS

## 2021-11-11 ENCOUNTER — Encounter: Payer: Self-pay | Admitting: Emergency Medicine

## 2021-11-11 ENCOUNTER — Ambulatory Visit (INDEPENDENT_AMBULATORY_CARE_PROVIDER_SITE_OTHER): Payer: Medicare Other | Admitting: Emergency Medicine

## 2021-11-11 DIAGNOSIS — F17200 Nicotine dependence, unspecified, uncomplicated: Secondary | ICD-10-CM

## 2021-11-11 DIAGNOSIS — R918 Other nonspecific abnormal finding of lung field: Secondary | ICD-10-CM

## 2021-11-11 NOTE — Patient Instructions (Signed)
We will repeat your CT scan of the chest in January 2024. We will consider performing pulmonary function testing at some point going forward.  No indication to start inhaler medications at this time. Follow Dr. Lamonte Sakai in January after your CT so we can review the results together.

## 2021-11-11 NOTE — Assessment & Plan Note (Signed)
Needs smoking cessation.  She does not have any dyspnea, is not on any BD currently.  Will likely need PFT at some point going forward.

## 2021-11-11 NOTE — Assessment & Plan Note (Signed)
Right upper lobe and left upper lobe pulmonary nodules, recently reimaged 09/2021.  These had been followed with lung cancer screening CT and showed the right upper lobe nodule was enlarging.  A biopsy done in 2019 was nondiagnostic/negative.  The slow rate of change, negative PET scan are both reassuring.  The smooth margins are also reassuring, would be most consistent with a hamartoma although I suppose a carcinoid could be PET negative as well.  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  We talked about possible navigational bronchoscopy to get a tissue diagnosis.  This may become necessary going forward.  For now we are both comfortable with serial follow-up.  I will check a repeat CT chest in 6 months.

## 2021-11-11 NOTE — Progress Notes (Signed)
Subjective:    Patient ID: Kathleen Navarro, female    DOB: 27-Apr-1960, 61 y.o.   MRN: [id number]  HPI 61 year old smoker (45 pack years), still smokes 10 cigarettes daily, with a history of hyperlipidemia and cervical disc disease.  She has been evaluated for pulmonary nodular disease in the past, underwent CT-guided percutaneous needle biopsy of a 9.4 mm right upper lobe pulmonary nodule.  Per office notes she had a repeat CT chest 09/2018 that showed stable 9.4 mm right upper lobe nodule, stable 13.4 mm left upper lobe nodule.  She is referred today for further follow-up of her abnormal CT chest, new imaging done 09/2021 as below. She is active, is able to exert. No cough or wheeze. No CP. She does cough sometimes when her allergies are active.    Lung cancer screening CT chest 09/16/2021 reviewed by me shows paraseptal and centrilobular emphysema, solid 14.5 mm right upper lobe nodule (increased from 9-10 mm), stable 12 mm left upper lobe pulmonary nodule.  There is a new small 3 mm left lower lobe nodule.  All other previously seen nodules are stable in size and appearance.  PET scan 10/01/2021 reviewed by me shows some possible mild uptake in the solid right upper lobe pulmonary nodule (SUV 1.3 which is below blood pool).  No significant hypermetabolism in the left upper lobe nodule either (SUV 1.7).  No mediastinal adenopathy, no distant disease    Review of Systems As per HPI  Past Medical History:  Diagnosis Date   HLD (hyperlipidemia)    Ruptured disc, cervical    C4-5, C5-6     Family History  Problem Relation Age of Onset   Lung cancer Mother    Stroke Mother    Hypertension Mother    Heart disease Father    Hypertension Sister    Breast cancer Neg Hx      Social History   Socioeconomic History   Marital status: Married    Spouse name: Not on file   Number of children: Not on file   Years of education: Not on file   Highest education level: Not on file   Occupational History   Not on file  Tobacco Use   Smoking status: Every Day    Packs/day: 1.50    Years: 29.00    Total pack years: 43.50    Types: Cigarettes   Smokeless tobacco: Never   Tobacco comments:    Smokes 10 cigarettes daily ARJ 11/11/21  Substance and Sexual Activity   Alcohol use: No    Alcohol/week: 0.0 standard drinks of alcohol   Drug use: No   Sexual activity: Yes    Birth control/protection: Surgical  Other Topics Concern   Not on file  Social History Narrative   Not on file   Social Determinants of Health   Financial Resource Strain: Not on file  Food Insecurity: Not on file  Transportation Needs: Not on file  Physical Activity: Not on file  Stress: Not on file  Social Connections: Not on file  Intimate Partner Violence: Not on file     Allergies  Allergen Reactions   Macrodantin [Nitrofurantoin Macrocrystal]      Outpatient Medications Prior to Visit  Medication Sig Dispense Refill   Cholecalciferol (VITAMIN D) 50 MCG (2000 UT) CAPS      diphenhydrAMINE HCl (ALLERGY MEDICATION PO)      amoxicillin-clavulanate (AUGMENTIN) 875-125 MG tablet Take 1 tablet by mouth every 12 (twelve) hours. 14 tablet 0  cetirizine (ZYRTEC ALLERGY) 10 MG tablet Take 1 tablet (10 mg total) by mouth daily. 30 tablet 0   clindamycin (CLEOCIN) 150 MG capsule Take 2 capsules (300 mg total) by mouth every 6 (six) hours. 56 capsule 0   HYDROcodone-acetaminophen (NORCO/VICODIN) 5-325 MG tablet Take 1-2 tablets every 6 hours as needed for severe pain 8 tablet 0   pregabalin (LYRICA) 75 MG capsule Take 75 mg by mouth at bedtime.     No facility-administered medications prior to visit.         Objective:   Physical Exam Vitals:   11/11/21 1353  BP: 136/74  Pulse: 78  Temp: 98.1 F (36.7 C)  TempSrc: Oral  SpO2: 96%  Weight: 167 lb (75.8 kg)  Height: 5' 5.5" (1.664 m)   Gen: Pleasant, well-nourished, in no distress,  normal affect  ENT: No lesions,  mouth clear,   oropharynx clear, no postnasal drip  Neck: No JVD, no stridor  Lungs: No use of accessory muscles, no crackles or wheezing on normal respiration, no wheeze on forced expiration  Cardiovascular: RRR, heart sounds normal, no murmur or gallops, no peripheral edema  Musculoskeletal: No deformities, no cyanosis or clubbing  Neuro: alert, awake, non focal  Skin: Warm, no lesions or rash      Assessment & Plan:  Pulmonary nodules/lesions, multiple Right upper lobe and left upper lobe pulmonary nodules, recently reimaged 09/2021.  These had been followed with lung cancer screening CT and showed the right upper lobe nodule was enlarging.  A biopsy done in 2019 was nondiagnostic/negative.  The slow rate of change, negative PET scan are both reassuring.  The smooth margins are also reassuring, would be most consistent with a hamartoma although I suppose a carcinoid could be PET negative as well.  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  We talked about possible navigational bronchoscopy to get a tissue diagnosis.  This may become necessary going forward.  For now we are both comfortable with serial follow-up.  I will check a repeat CT chest in 6 months.  TOBACCO DEPENDENCE Needs smoking cessation.  She does not have any dyspnea, is not on any BD currently.  Will likely need PFT at some point going forward.   Baltazar Apo, MD, PhD 11/11/2021, 2:47 PM [facility] [phone] or if no answer before 7:00PM call [phone] For any issues after 7:00PM please call eLink [phone]

## 2022-03-17 ENCOUNTER — Ambulatory Visit (HOSPITAL_BASED_OUTPATIENT_CLINIC_OR_DEPARTMENT_OTHER)
Admission: RE | Admit: 2022-03-17 | Discharge: 2022-03-17 | Disposition: A | Discharge status: Home / Self Care | Payer: Medicare Other | Source: Ambulatory Visit | Attending: Emergency Medicine | Admitting: Emergency Medicine

## 2022-03-17 DIAGNOSIS — R918 Other nonspecific abnormal finding of lung field: Secondary | ICD-10-CM | POA: Diagnosis not present

## 2022-05-06 ENCOUNTER — Ambulatory Visit (INDEPENDENT_AMBULATORY_CARE_PROVIDER_SITE_OTHER): Payer: Medicare Other | Admitting: Emergency Medicine

## 2022-05-06 ENCOUNTER — Encounter: Payer: Self-pay | Admitting: Emergency Medicine

## 2022-05-06 VITALS — BP 122/74 | HR 82 | Temp 98.1°F | Ht 66.0 in | Wt 169.0 lb

## 2022-05-06 DIAGNOSIS — R918 Other nonspecific abnormal finding of lung field: Secondary | ICD-10-CM | POA: Diagnosis not present

## 2022-05-06 DIAGNOSIS — F17200 Nicotine dependence, unspecified, uncomplicated: Secondary | ICD-10-CM

## 2022-05-06 DIAGNOSIS — F17210 Nicotine dependence, cigarettes, uncomplicated: Secondary | ICD-10-CM | POA: Diagnosis not present

## 2022-05-06 NOTE — Progress Notes (Signed)
Subjective:    Patient ID: Kathleen Navarro, female    DOB: Oct 16, 1960, 62 y.o.   MRN: [id number]  HPI 62 year old smoker (45 pack years), still smokes 10 cigarettes daily, with a history of hyperlipidemia and cervical disc disease.  She has been evaluated for pulmonary nodular disease in the past, underwent CT-guided percutaneous needle biopsy of a 9.4 mm right upper lobe pulmonary nodule.  Per office notes she had a repeat CT chest 09/2018 that showed stable 9.4 mm right upper lobe nodule, stable 13.4 mm left upper lobe nodule.  She is referred today for further follow-up of her abnormal CT chest, new imaging done 09/2021 as below. She is active, is able to exert. No cough or wheeze. No CP. She does cough sometimes when her allergies are active.   Lung cancer screening CT chest 09/16/2021 reviewed by me shows paraseptal and centrilobular emphysema, solid 14.5 mm right upper lobe nodule (increased from 9-10 mm), stable 12 mm left upper lobe pulmonary nodule.  There is a new small 3 mm left lower lobe nodule.  All other previously seen nodules are stable in size and appearance.  PET scan 10/01/2021 reviewed by me shows some possible mild uptake in the solid right upper lobe pulmonary nodule (SUV 1.3 which is below blood pool).  No significant hypermetabolism in the left upper lobe nodule either (SUV 1.7).  No mediastinal adenopathy, no distant disease   ROV 05/06/22 --follow-up visit 62 year old woman with a history of tobacco use and history of pulmonary nodular disease.  This included a percutaneous needle biopsy of a 9 mm right upper lobe pulmonary nodule.  Subsequent imaging showed that that nodule was stable and that she had a 13 mm left upper lobe nodule.  I saw her in September after lung cancer screening CT July 2023 showed an increase in the right upper lobe nodule to 14.5 mm.  This prompted PET scan that did not show significant hypermetabolism.  Repeat CT chest done 03/17/2022 as below. She is  not having any SOB w normal activities. She is motivated to quit smoking, currently about 10 a day. Has tried nicotine replacement before.   CT scan of chest 03/17/2022 reviewed by me, shows no mediastinal or hilar adenopathy, some centrilobular emphysematous change, noncalcified 15 x 13 mm right upper lobe pulmonary nodule.  Slight increase in size of left upper lobe nodule 16 x 11 mm.    Review of Systems As per HPI  Past Medical History:  Diagnosis Date   HLD (hyperlipidemia)    Ruptured disc, cervical    C4-5, C5-6     Family History  Problem Relation Age of Onset   Lung cancer Mother    Stroke Mother    Hypertension Mother    Heart disease Father    Hypertension Sister    Breast cancer Neg Hx      Social History   Socioeconomic History   Marital status: Married    Spouse name: Not on file   Number of children: Not on file   Years of education: Not on file   Highest education level: Not on file  Occupational History   Not on file  Tobacco Use   Smoking status: Every Day    Packs/day: 1.50    Years: 29.00    Total pack years: 43.50    Types: Cigarettes   Smokeless tobacco: Never   Tobacco comments:    8 - 12 cigarettes smoked daily ARJ 05/06/22  Substance and Sexual  Activity   Alcohol use: No    Alcohol/week: 0.0 standard drinks of alcohol   Drug use: No   Sexual activity: Yes    Birth control/protection: Surgical  Other Topics Concern   Not on file  Social History Narrative   Not on file   Social Determinants of Health   Financial Resource Strain: Not on file  Food Insecurity: Not on file  Transportation Needs: Not on file  Physical Activity: Not on file  Stress: Not on file  Social Connections: Not on file  Intimate Partner Violence: Not on file     Allergies  Allergen Reactions   Macrodantin [Nitrofurantoin Macrocrystal]      Outpatient Medications Prior to Visit  Medication Sig Dispense Refill   Cholecalciferol (VITAMIN D) 50 MCG (2000  UT) CAPS      diphenhydrAMINE HCl (ALLERGY MEDICATION PO)      amoxicillin-clavulanate (AUGMENTIN) 875-125 MG tablet Take 1 tablet by mouth every 12 (twelve) hours. 14 tablet 0   cetirizine (ZYRTEC ALLERGY) 10 MG tablet Take 1 tablet (10 mg total) by mouth daily. 30 tablet 0   clindamycin (CLEOCIN) 150 MG capsule Take 2 capsules (300 mg total) by mouth every 6 (six) hours. 56 capsule 0   HYDROcodone-acetaminophen (NORCO/VICODIN) 5-325 MG tablet Take 1-2 tablets every 6 hours as needed for severe pain 8 tablet 0   pregabalin (LYRICA) 75 MG capsule Take 75 mg by mouth at bedtime.     No facility-administered medications prior to visit.         Objective:   Physical Exam Vitals:   05/06/22 1110  BP: 122/74  Pulse: 82  Temp: 98.1 F (36.7 C)  TempSrc: Oral  SpO2: 97%  Weight: 169 lb (76.7 kg)  Height: '5\' 6"'$  (1.676 m)   Gen: Pleasant, well-nourished, in no distress,  normal affect  ENT: No lesions,  mouth clear,  oropharynx clear, no postnasal drip  Neck: No JVD, no stridor  Lungs: No use of accessory muscles, no crackles or wheezing on normal respiration, no wheeze on forced expiration  Cardiovascular: RRR, heart sounds normal, no murmur or gallops, no peripheral edema  Musculoskeletal: No deformities, no cyanosis or clubbing  Neuro: alert, awake, non focal  Skin: Warm, no lesions or rash      Assessment & Plan:  Pulmonary nodules/lesions, multiple Slowly enlarging right upper lobe and left upper lobe pulmonary nodules dating back to 2016.  Characteristics suggestive of benign tumor either carcinoid or hamartoma.  I did discuss with her today that it is possible that these could represent slow-growing adenocarcinoma.  We talked about the possible strategies including bronchoscopy for biopsy, even surgical resection.  We also talked about serial follow-up imaging.  She would like to defer diagnostics for now, plan to repeat her CT chest in 6 months.  Depending on results we  will decide whether diagnostics are needed  TOBACCO DEPENDENCE She does have some dyspnea with heavier exertion.  Denies any shortness of breath or other symptoms with her normal activities.  We will hold off on bronchodilator therapy for now.  She will probably need pulmonary function testing at some point in the future.  We spent several minutes talking about smoking and smoking cessation strategies.  For now we will set serial goals to try to wean her daily number of cigarettes.  Goal will be to get to 5 cigarettes daily by our next visit.  If she is able to accomplish this then we will consider starting Wellbutrin and  setting a quit date.   Baltazar Apo, MD, PhD 05/06/2022, 11:38 AM [facility] [phone] or if no answer before 7:00PM call [phone] For any issues after 7:00PM please call eLink [phone]

## 2022-05-06 NOTE — Addendum Note (Signed)
Addended by: Gavin Potters R on: 05/06/2022 11:42 AM   Modules accepted: Orders

## 2022-05-06 NOTE — Assessment & Plan Note (Signed)
Slowly enlarging right upper lobe and left upper lobe pulmonary nodules dating back to 2016.  Characteristics suggestive of benign tumor either carcinoid or hamartoma.  I did discuss with her today that it is possible that these could represent slow-growing adenocarcinoma.  We talked about the possible strategies including bronchoscopy for biopsy, even surgical resection.  We also talked about serial follow-up imaging.  She would like to defer diagnostics for now, plan to repeat her CT chest in 6 months.  Depending on results we will decide whether diagnostics are needed

## 2022-05-06 NOTE — Assessment & Plan Note (Signed)
She does have some dyspnea with heavier exertion.  Denies any shortness of breath or other symptoms with her normal activities.  We will hold off on bronchodilator therapy for now.  She will probably need pulmonary function testing at some point in the future.  We spent several minutes talking about smoking and smoking cessation strategies.  For now we will set serial goals to try to wean her daily number of cigarettes.  Goal will be to get to 5 cigarettes daily by our next visit.  If she is able to accomplish this then we will consider starting Wellbutrin and setting a quit date.

## 2022-05-06 NOTE — Patient Instructions (Signed)
We reviewed your CT scan of the chest today. We will plan to repeat your CT chest without contrast in July 2024 to follow your pulmonary nodules. We will hold off on starting any inhaler medication at this time We talked today about smoking cessation.  We set a goal to begin to wean the number of cigarettes she smoked daily.  Try to get down to 5 cigarettes daily by our next visit.  If we are able to accomplish this then we can talk about medications that will help [place] set a quit date and stop completely. Follow-up with Dr. Lamonte Sakai in July after your CT scan so we can review the results together.  Please call sooner if you have any problems.

## 2022-09-13 ENCOUNTER — Other Ambulatory Visit: Payer: Self-pay | Admitting: Internal Medicine

## 2022-09-13 DIAGNOSIS — Z1231 Encounter for screening mammogram for malignant neoplasm of breast: Secondary | ICD-10-CM

## 2022-09-16 ENCOUNTER — Ambulatory Visit
Admission: RE | Admit: 2022-09-16 | Discharge: 2022-09-16 | Disposition: A | Discharge status: Home / Self Care | Payer: Medicare Other | Source: Ambulatory Visit | Attending: Emergency Medicine | Admitting: Emergency Medicine

## 2022-09-16 DIAGNOSIS — R918 Other nonspecific abnormal finding of lung field: Secondary | ICD-10-CM

## 2022-09-20 ENCOUNTER — Ambulatory Visit
Admission: RE | Admit: 2022-09-20 | Discharge: 2022-09-20 | Disposition: A | Discharge status: Home / Self Care | Payer: Medicare Other | Source: Ambulatory Visit | Attending: Internal Medicine | Admitting: Internal Medicine

## 2022-09-20 DIAGNOSIS — Z1231 Encounter for screening mammogram for malignant neoplasm of breast: Secondary | ICD-10-CM

## 2022-09-22 ENCOUNTER — Ambulatory Visit: Payer: Medicare Other | Admitting: Emergency Medicine

## 2022-09-22 ENCOUNTER — Encounter: Payer: Self-pay | Admitting: Emergency Medicine

## 2022-09-22 VITALS — BP 120/74 | HR 90 | Temp 97.9°F | Ht 65.5 in | Wt 169.8 lb

## 2022-09-22 DIAGNOSIS — R918 Other nonspecific abnormal finding of lung field: Secondary | ICD-10-CM

## 2022-09-22 NOTE — Addendum Note (Signed)
Addended by: Dorisann Frames R on: 09/22/2022 02:44 PM   Modules accepted: Orders

## 2022-09-22 NOTE — Assessment & Plan Note (Signed)
Again there may be a slight increase in size in her right upper lobe pulmonary nodule.  It looks quite similar to her previous scan.  The left upper lobe nodule is unchanged as well.  We have discussed the differential diagnosis including benign tumor, carcinoid/hamartoma versus inflammatory process versus possible highly differentiated adenocarcinoma.  She did have a reassuring biopsy in 2019 and these are PET negative.  We talked about bronchoscopy.  For now she would like to continue to do CT surveillance.  I think this is a reasonable plan given the other data we have collected.  Repeat CT in January 2025.  If there is an interval change then we will need to reconsider diagnostic testing.

## 2022-09-22 NOTE — Patient Instructions (Signed)
We reviewed your CT scan of the chest today. We discussed the options for evaluation of upper lobe pulmonary nodules including biopsy by bronchoscopy, serial imaging by CT scan. We will plan to repeat your CT scan of the chest in January 2025 to compare with priors. Follow Dr. Delton Coombes in January after your CT so we can review the results together and discuss next steps in your evaluation.

## 2022-09-22 NOTE — Progress Notes (Signed)
Subjective:    Patient ID: Kathleen Navarro, female    DOB: April 30, 1960, 62 y.o.   MRN: [id number]  HPI  ROV 05/06/22 --follow-up visit 62 year old woman with a history of tobacco use and history of pulmonary nodular disease.  This included a percutaneous needle biopsy of a 9 mm right upper lobe pulmonary nodule.  Subsequent imaging showed that that nodule was stable and that she had a 13 mm left upper lobe nodule.  I saw her in September after lung cancer screening CT July 2023 showed an increase in the right upper lobe nodule to 14.5 mm.  This prompted PET scan that did not show significant hypermetabolism.  Repeat CT chest done 03/17/2022 as below. She is not having any SOB w normal activities. She is motivated to quit smoking, currently about 10 a day. Has tried nicotine replacement before.   CT scan of chest 03/17/2022 reviewed by me, shows no mediastinal or hilar adenopathy, some centrilobular emphysematous change, noncalcified 15 x 13 mm right upper lobe pulmonary nodule.  Slight increase in size of left upper lobe nodule 16 x 11 mm.   ROV 09/22/2022 --follow-up visit for 62 year old woman with a history of tobacco use, COPD, pulmonary nodular disease (remote percutaneous CT-guided needle biopsy 9.4 mm right upper lobe pulmonary nodule 08/26/2017 >> macrophages, fibrosis and mononuclear cells).  We have been following her pulmonary nodules with serial imaging going back as far as 2016.  There was slight increase in size on her most recent CT chest, consistent with possible benign tumor, carcinoid, hamartoma or even possibly slow-growing adenocarcinoma.  We deferred biopsy and set a repeat her CT chest as below.  They were not hypermetabolic on PET scan, last done 09/2021.  Super D CT chest 09/20/2022 reviewed by me, shows no change in a 16 mm left upper lobe pulmonary nodule, slight increase in size of her right upper lobe nodule from 15 mm to 16 mm.   Review of Systems As per HPI  Past Medical  History:  Diagnosis Date   HLD (hyperlipidemia)    Ruptured disc, cervical    C4-5, C5-6     Family History  Problem Relation Age of Onset   Lung cancer Mother    Stroke Mother    Hypertension Mother    Heart disease Father    Hypertension Sister    Breast cancer Neg Hx      Social History   Socioeconomic History   Marital status: Married    Spouse name: Not on file   Number of children: Not on file   Years of education: Not on file   Highest education level: Not on file  Occupational History   Not on file  Tobacco Use   Smoking status: Every Day    Current packs/day: 1.50    Average packs/day: 1.5 packs/day for 29.0 years (43.5 ttl pk-yrs)    Types: Cigarettes   Smokeless tobacco: Never   Tobacco comments:    10 -16 cigarettes smoked daily ARJ 09/22/22  Substance and Sexual Activity   Alcohol use: No    Alcohol/week: 0.0 standard drinks of alcohol   Drug use: No   Sexual activity: Yes    Birth control/protection: Surgical  Other Topics Concern   Not on file  Social History Narrative   Not on file   Social Determinants of Health   Financial Resource Strain: Not on file  Food Insecurity: Not on file  Transportation Needs: Not on file  Physical Activity: Not  on file  Stress: Not on file  Social Connections: Not on file  Intimate Partner Violence: Not on file     Allergies  Allergen Reactions   Macrodantin [Nitrofurantoin Macrocrystal]      Outpatient Medications Prior to Visit  Medication Sig Dispense Refill   Cholecalciferol (VITAMIN D) 50 MCG (2000 UT) CAPS      diphenhydrAMINE HCl (ALLERGY MEDICATION PO)      amoxicillin-clavulanate (AUGMENTIN) 875-125 MG tablet Take 1 tablet by mouth every 12 (twelve) hours. 14 tablet 0   cetirizine (ZYRTEC ALLERGY) 10 MG tablet Take 1 tablet (10 mg total) by mouth daily. 30 tablet 0   clindamycin (CLEOCIN) 150 MG capsule Take 2 capsules (300 mg total) by mouth every 6 (six) hours. 56 capsule 0    HYDROcodone-acetaminophen (NORCO/VICODIN) 5-325 MG tablet Take 1-2 tablets every 6 hours as needed for severe pain 8 tablet 0   pregabalin (LYRICA) 75 MG capsule Take 75 mg by mouth at bedtime.     No facility-administered medications prior to visit.         Objective:   Physical Exam Vitals:   09/22/22 1409  BP: 120/74  Pulse: 90  Temp: 97.9 F (36.6 C)  TempSrc: Oral  SpO2: 96%  Weight: 169 lb 12.8 oz (77 kg)  Height: 5' 5.5" (1.664 m)   Gen: Pleasant, well-nourished, in no distress,  normal affect  ENT: No lesions,  mouth clear,  oropharynx clear, no postnasal drip  Neck: No JVD, no stridor  Lungs: No use of accessory muscles, no crackles or wheezing on normal respiration, no wheeze on forced expiration  Cardiovascular: RRR, heart sounds normal, no murmur or gallops, no peripheral edema  Musculoskeletal: No deformities, no cyanosis or clubbing  Neuro: alert, awake, non focal  Skin: Warm, no lesions or rash      Assessment & Plan:  Pulmonary nodules/lesions, multiple Again there may be a slight increase in size in her right upper lobe pulmonary nodule.  It looks quite similar to her previous scan.  The left upper lobe nodule is unchanged as well.  We have discussed the differential diagnosis including benign tumor, carcinoid/hamartoma versus inflammatory process versus possible highly differentiated adenocarcinoma.  She did have a reassuring biopsy in 2019 and these are PET negative.  We talked about bronchoscopy.  For now she would like to continue to do CT surveillance.  I think this is a reasonable plan given the other data we have collected.  Repeat CT in January 2025.  If there is an interval change then we will need to reconsider diagnostic testing.    Levy Pupa, MD, PhD 09/22/2022, 2:40 PM [facility] [phone] or if no answer before 7:00PM call [phone] For any issues after 7:00PM please call eLink [phone]

## 2023-03-16 ENCOUNTER — Ambulatory Visit
Admission: RE | Admit: 2023-03-16 | Discharge: 2023-03-16 | Disposition: A | Discharge status: Home / Self Care | Payer: Medicare Other | Source: Ambulatory Visit | Attending: Emergency Medicine | Admitting: Emergency Medicine

## 2023-03-16 DIAGNOSIS — R918 Other nonspecific abnormal finding of lung field: Secondary | ICD-10-CM

## 2023-07-29 ENCOUNTER — Ambulatory Visit: Payer: Self-pay | Admitting: Emergency Medicine

## 2023-07-29 DIAGNOSIS — R918 Other nonspecific abnormal finding of lung field: Secondary | ICD-10-CM

## 2023-08-13 ENCOUNTER — Encounter (HOSPITAL_BASED_OUTPATIENT_CLINIC_OR_DEPARTMENT_OTHER): Payer: Self-pay

## 2023-08-13 ENCOUNTER — Other Ambulatory Visit: Payer: Self-pay

## 2023-08-13 ENCOUNTER — Emergency Department (HOSPITAL_BASED_OUTPATIENT_CLINIC_OR_DEPARTMENT_OTHER)
Admission: EM | Admit: 2023-08-13 | Discharge: 2023-08-13 | Disposition: A | Discharge status: Home / Self Care | Attending: Emergency Medicine | Admitting: Emergency Medicine

## 2023-08-13 DIAGNOSIS — U071 COVID-19: Secondary | ICD-10-CM | POA: Diagnosis not present

## 2023-08-13 DIAGNOSIS — R059 Cough, unspecified: Secondary | ICD-10-CM | POA: Diagnosis present

## 2023-08-13 LAB — RESP PANEL BY RT-PCR (RSV, FLU A&B, COVID)  RVPGX2
Influenza A by PCR: NEGATIVE
Influenza B by PCR: NEGATIVE
Resp Syncytial Virus by PCR: NEGATIVE
SARS Coronavirus 2 by RT PCR: POSITIVE — AB

## 2023-08-13 NOTE — ED Triage Notes (Signed)
 Pt POV reporting flu-like sx after returning from [place] last week. Multiple people from trip are now covid+. Denies SOB, NAD noted at this time.

## 2023-08-13 NOTE — ED Provider Notes (Signed)
 [place] EMERGENCY DEPARTMENT AT [facility] Provider Note   CSN: [id number] Arrival date & time: 08/13/23  1820     History  Chief Complaint  Patient presents with   URI    Kathleen Navarro is a 63 y.o. female.  Patient is a 63 year old female who presents with a 3 to 4-day history of flulike symptoms.  She has had runny nose congestion and coughing.  She denies any known fevers.  She has had myalgias.  No shortness of breath.  No vomiting or diarrhea.  She recently traveled from [place] and multiple other family members were also sick, some with COVID.       Home Medications Prior to Admission medications   Medication Sig Start Date End Date Taking? Authorizing Provider  amoxicillin -clavulanate (AUGMENTIN ) 875-125 MG tablet Take 1 tablet by mouth every 12 (twelve) hours. 02/15/21   White, Maybelle Spatz, NP  cetirizine  (ZYRTEC  ALLERGY) 10 MG tablet Take 1 tablet (10 mg total) by mouth daily. 02/09/21   Reena Canning, NP  Cholecalciferol (VITAMIN D) 50 MCG (2000 UT) CAPS  08/06/20   [provider]  clindamycin  (CLEOCIN ) 150 MG capsule Take 2 capsules (300 mg total) by mouth every 6 (six) hours. 03/08/16   Geiple, Joshua, PA-C  diphenhydrAMINE HCl (ALLERGY MEDICATION PO)     [provider]  HYDROcodone -acetaminophen  (NORCO/VICODIN) 5-325 MG tablet Take 1-2 tablets every 6 hours as needed for severe pain 03/08/16   Geiple, Joshua, PA-C  pregabalin (LYRICA) 75 MG capsule Take 75 mg by mouth at bedtime.    [provider]      Allergies    Macrodantin [nitrofurantoin macrocrystal]    Review of Systems   Review of Systems  Constitutional:  Positive for fatigue. Negative for chills, diaphoresis and fever.  HENT:  Positive for congestion and rhinorrhea. Negative for sneezing.   Eyes: Negative.   Respiratory:  Positive for cough. Negative for chest tightness and shortness of breath.   Cardiovascular:  Negative for chest pain and leg swelling.   Gastrointestinal:  Negative for abdominal pain, blood in stool, diarrhea, nausea and vomiting.  Genitourinary:  Negative for difficulty urinating, flank pain, frequency and hematuria.  Musculoskeletal:  Positive for myalgias.  Skin:  Negative for rash.  Neurological:  Negative for dizziness, speech difficulty, weakness, numbness and headaches.    Physical Exam Updated Vital Signs BP (!) 153/86 (BP Location: Right Arm)   Pulse 91   Temp 97.7 F (36.5 C)   Resp 18   Ht 5\' 5"  (1.651 m)   Wt 77.1 kg   SpO2 97%   BMI 28.29 kg/m  Physical Exam Constitutional:      Appearance: She is well-developed.  HENT:     Head: Normocephalic and atraumatic.  Eyes:     Pupils: Pupils are equal, round, and reactive to light.  Cardiovascular:     Rate and Rhythm: Normal rate and regular rhythm.     Heart sounds: Normal heart sounds.  Pulmonary:     Effort: Pulmonary effort is normal. No respiratory distress.     Breath sounds: Normal breath sounds. No wheezing or rales.  Chest:     Chest wall: No tenderness.  Abdominal:     General: Bowel sounds are normal.     Palpations: Abdomen is soft.     Tenderness: There is no abdominal tenderness. There is no guarding or rebound.  Musculoskeletal:        General: Normal range of motion.  Cervical back: Normal range of motion and neck supple.  Lymphadenopathy:     Cervical: No cervical adenopathy.  Skin:    General: Skin is warm and dry.     Findings: No rash.  Neurological:     Mental Status: She is alert and oriented to person, place, and time.     ED Results / Procedures / Treatments   Labs (all labs ordered are listed, but only abnormal results are displayed) Labs Reviewed  RESP PANEL BY RT-PCR (RSV, FLU A&B, COVID)  RVPGX2 - Abnormal; Notable for the following components:      Result Value   SARS Coronavirus 2 by RT PCR POSITIVE (*)    All other components within normal limits    EKG None  Radiology No results  found.  Procedures Procedures    Medications Ordered in ED Medications - No data to display  ED Course/ Medical Decision Making/ A&P                                 Medical Decision Making Amount and/or Complexity of Data Reviewed Radiology: ordered.   This patient presents to the ED for concern of flulike symptoms, this involves an extensive number of treatment options, and is a complaint that carries with it a high risk of complications and morbidity.  I considered the following differential and admission for this acute, potentially life threatening condition.  The differential diagnosis includes upper respiratory infection, COVID, influenza, sepsis, pneumonia  MDM:    Patient presents with flulike symptoms for 3 to 4 days.  She does not appear systemically ill.  Her vital signs are stable.  No fever.  No hypoxia.  Her lungs are clear without clinical suggestions of pneumonia.  I did discuss with her doing a chest x-ray which initially I did order but then she decided that she did not feel like she really needed it.  I feel this is appropriate.  She will follow-up with her primary care doctor if her symptoms are not improving.  She does not have other symptoms that would be more concerning for PE.  She is overall well-appearing.  She was discharged home in good condition.  Return precautions were given.  (Labs, imaging, consults)  Labs: I Ordered, and personally interpreted labs.  The pertinent results include: Viral panel  Imaging Studies ordered: I ordered imaging studies including none I independently visualized and interpreted imaging. I agree with the radiologist interpretation  Additional history obtained from chart review.  External records from outside source obtained and reviewed including medications  Cardiac Monitoring: The patient was maintained on a cardiac monitor.  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   I have reviewed the patients home medicines and have made adjustments as needed  Problem List / ED Course: Problem List Items Addressed This Visit   None Visit Diagnoses       COVID-19  virus infection    -  Primary            Final Clinical Impression(s) / ED Diagnoses Final diagnoses:  COVID-19 virus infection    Rx / DC Orders ED Discharge Orders     None         Hershel Los, MD 08/13/23 2044

## 2023-08-16 ENCOUNTER — Other Ambulatory Visit (HOSPITAL_COMMUNITY): Payer: Self-pay

## 2023-08-16 MED ORDER — MOLNUPIRAVIR 200 MG PO CAPS
4.0000 | ORAL_CAPSULE | Freq: Two times a day (BID) | ORAL | 0 refills | Status: DC
  Filled 2023-08-16: qty 40, 5d supply, fill #0

## 2023-09-19 ENCOUNTER — Other Ambulatory Visit: Payer: Self-pay | Admitting: Internal Medicine

## 2023-09-19 DIAGNOSIS — Z1231 Encounter for screening mammogram for malignant neoplasm of breast: Secondary | ICD-10-CM

## 2023-09-22 ENCOUNTER — Encounter: Payer: Self-pay | Admitting: Pediatrics

## 2023-09-22 ENCOUNTER — Ambulatory Visit (AMBULATORY_SURGERY_CENTER)

## 2023-09-22 VITALS — Ht 65.5 in | Wt 172.0 lb

## 2023-09-22 DIAGNOSIS — Z1211 Encounter for screening for malignant neoplasm of colon: Secondary | ICD-10-CM

## 2023-09-22 MED ORDER — NA SULFATE-K SULFATE-MG SULF 17.5-3.13-1.6 GM/177ML PO SOLN
1.0000 | Freq: Once | ORAL | 0 refills | Status: AC
Start: 2023-09-22 — End: 2023-09-22

## 2023-09-22 NOTE — Progress Notes (Signed)
 No egg or soy allergy known to patient  No issues known to pt with past sedation with any surgeries or procedures Patient denies ever being told they had issues or difficulty with intubation  No FH of Malignant Hyperthermia Pt is not on diet pills nor GLP-1 medications Pt is not on  home 02  Pt is not on blood thinners  Pt states occasional issues with chronic constipation; uses laxative PRN  No A fib or A flutter Have any cardiac testing pending--no Pt instructed to use [email or web address] or GoodRx for a price reduction on prep  Ambulates independently

## 2023-09-28 NOTE — Progress Notes (Signed)
 Atc x2. Lmtcb. I will send pt a message on Mychart to inform her then completing note per protocol. NFN

## 2023-10-03 ENCOUNTER — Ambulatory Visit
Admission: RE | Admit: 2023-10-03 | Discharge: 2023-10-03 | Disposition: A | Discharge status: Home / Self Care | Source: Ambulatory Visit | Attending: Internal Medicine | Admitting: Internal Medicine

## 2023-10-03 DIAGNOSIS — Z1231 Encounter for screening mammogram for malignant neoplasm of breast: Secondary | ICD-10-CM

## 2023-10-04 NOTE — Progress Notes (Unsigned)
 [facility] History and Physical   Primary Care Physician:  Larnell Hamilton, MD   Reason for Procedure:  Colorectal cancer screening  Plan:    Screening colonoscopy     HPI: Kathleen Navarro is a 63 y.o. female undergoing screening colonoscopy for colorectal cancer screening.  Patient's last colonoscopy was performed in 2015 and was normal without polyps.  No family history of colorectal cancer or colon polyps.  Patient denies change in bowel habits or rectal bleeding at the time of this exam.   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 Authorizing Provider  Cholecalciferol (VITAMIN D) 50 MCG (2000 UT) CAPS  08/06/20   [provider]  diphenhydrAMINE HCl (ALLERGY MEDICATION PO)     [provider]    Current Outpatient Medications  Medication Sig Dispense Refill   Cholecalciferol (VITAMIN D) 50 MCG (2000 UT) CAPS      diphenhydrAMINE HCl (ALLERGY MEDICATION PO)      No current facility-administered medications for this visit.    Allergies as of 10/05/2023 - Review Complete 09/22/2023  Allergen Reaction Noted   Macrodantin [nitrofurantoin macrocrystal] Rash 07/04/2013    Family History  Problem Relation Age of Onset   Lung cancer Mother    Stroke Mother    Hypertension Mother    Heart disease Father    Hypertension Sister    Esophageal cancer Maternal Grandmother    Breast cancer Neg Hx    Colon cancer Neg Hx    Rectal cancer Neg Hx    Stomach cancer Neg Hx    Colon polyps Neg Hx    BRCA 1/2 Neg Hx     Social History   Socioeconomic History   Marital status: Married    Spouse name: Not on file   Number of children: Not on file    Years of education: Not on file   Highest education level: Not on file  Occupational History   Not on file  Tobacco Use   Smoking status: Every Day    Current packs/day: 1.50    Average packs/day: 1.5 packs/day for 29.0 years (43.5 ttl pk-yrs)    Types: Cigarettes   Smokeless tobacco: Never   Tobacco comments:    10 -16 cigarettes smoked daily ARJ 09/22/22  Vaping Use   Vaping status: Never Used  Substance and Sexual Activity   Alcohol use: No    Alcohol/week: 0.0 standard drinks of alcohol   Drug use: No   Sexual activity: Yes    Birth control/protection: Surgical  Other Topics Concern   Not on file  Social History Narrative   Not on file   Social Drivers of Health   Financial Resource Strain: Not on file  Food Insecurity: Not on file  Transportation Needs: Not on file  Physical Activity: Not on file  Stress: Not on file  Social Connections: Not on file  Intimate Partner Violence: Not on file    Review of Systems:  All other review of systems negative except as mentioned in the HPI.  Physical Exam: Vital signs There were no vitals taken for this visit.  General:   Alert,  Well-developed, well-nourished, pleasant and cooperative in NAD Airway:  Mallampati  Lungs:  Clear throughout to auscultation.   Heart:  Regular rate and rhythm; no murmurs, clicks, rubs,  or gallops. Abdomen:  Soft, nontender and nondistended. Normal bowel sounds.   Neuro/Psych:  Normal mood and affect. A and O x 3  Inocente Hausen, MD [facility]

## 2023-10-05 ENCOUNTER — Encounter: Payer: Self-pay | Admitting: Pediatrics

## 2023-10-05 ENCOUNTER — Ambulatory Visit: Admitting: Pediatrics

## 2023-10-05 VITALS — BP 126/72 | HR 63 | Temp 97.3°F | Resp 14 | Ht 65.0 in | Wt 172.0 lb

## 2023-10-05 DIAGNOSIS — D125 Benign neoplasm of sigmoid colon: Secondary | ICD-10-CM

## 2023-10-05 DIAGNOSIS — D124 Benign neoplasm of descending colon: Secondary | ICD-10-CM

## 2023-10-05 DIAGNOSIS — K635 Polyp of colon: Secondary | ICD-10-CM

## 2023-10-05 DIAGNOSIS — D123 Benign neoplasm of transverse colon: Secondary | ICD-10-CM | POA: Diagnosis not present

## 2023-10-05 DIAGNOSIS — K644 Residual hemorrhoidal skin tags: Secondary | ICD-10-CM

## 2023-10-05 DIAGNOSIS — K648 Other hemorrhoids: Secondary | ICD-10-CM

## 2023-10-05 DIAGNOSIS — Z1211 Encounter for screening for malignant neoplasm of colon: Secondary | ICD-10-CM

## 2023-10-05 DIAGNOSIS — D128 Benign neoplasm of rectum: Secondary | ICD-10-CM

## 2023-10-05 DIAGNOSIS — Q438 Other specified congenital malformations of intestine: Secondary | ICD-10-CM

## 2023-10-05 MED ORDER — SODIUM CHLORIDE 0.9 % IV SOLN: 500.0000 mL | Freq: Once | INTRAVENOUS | Status: DC

## 2023-10-05 NOTE — Progress Notes (Signed)
 0822 BP 166/101, Labetalol given IV, MD update, vss

## 2023-10-05 NOTE — Progress Notes (Signed)
 Called to room to assist during endoscopic procedure.  Patient ID and intended procedure confirmed with present staff. Received instructions for my participation in the procedure from the performing physician.

## 2023-10-05 NOTE — Patient Instructions (Signed)
 Handout on polyps and hemorrhoids given to patient Await pathology results Resume previous diet and continue present medications Repeat colonoscopy for surveillance will be determined based off of pathology results    YOU HAD AN ENDOSCOPIC PROCEDURE TODAY AT THE [facility]:   Refer to the procedure report that was given to you for any specific questions about what was found during the examination.  If the procedure report does not answer your questions, please call your gastroenterologist to clarify.  If you requested that your care partner not be given the details of your procedure findings, then the procedure report has been included in a sealed envelope for you to review at your convenience later.  YOU SHOULD EXPECT: Some feelings of bloating in the abdomen. Passage of more gas than usual.  Walking can help get rid of the air that was put into your GI tract during the procedure and reduce the bloating. If you had a lower endoscopy (such as a colonoscopy or flexible sigmoidoscopy) you may notice spotting of blood in your stool or on the toilet paper. If you underwent a bowel prep for your procedure, you may not have a normal bowel movement for a few days.  Please Note:  You might notice some irritation and congestion in your nose or some drainage.  This is from the oxygen used during your procedure.  There is no need for concern and it should clear up in a day or so.  SYMPTOMS TO REPORT IMMEDIATELY:  Following lower endoscopy (colonoscopy or flexible sigmoidoscopy):  Excessive amounts of blood in the stool  Significant tenderness or worsening of abdominal pains  Swelling of the abdomen that is new, acute  Fever of 100F or higher  For urgent or emergent issues, a gastroenterologist can be reached at any hour by calling [phone]. Do not use MyChart messaging for urgent concerns.    DIET:  We do recommend a small meal at first, but then you may proceed to your regular  diet.  Drink plenty of fluids but you should avoid alcoholic beverages for 24 hours.  ACTIVITY:  You should plan to take it easy for the rest of today and you should NOT DRIVE or use heavy machinery until tomorrow (because of the sedation medicines used during the test).    FOLLOW UP: Our staff will call the number listed on your records the next business day following your procedure.  We will call around 7:15- 8:00 am to check on you and address any questions or concerns that you may have regarding the information given to you following your procedure. If we do not reach you, we will leave a message.     If any biopsies were taken you will be contacted by phone or by letter within the next 1-3 weeks.  Please call us at [phone] if you have not heard about the biopsies in 3 weeks.    SIGNATURES/CONFIDENTIALITY: You and/or your care partner have signed paperwork which will be entered into your electronic medical record.  These signatures attest to the fact that that the information above on your After Visit Summary has been reviewed and is understood.  Full responsibility of the confidentiality of this discharge information lies with you and/or your care-partner.

## 2023-10-05 NOTE — Progress Notes (Signed)
 Pt's states no medical or surgical changes since previsit or office visit.

## 2023-10-05 NOTE — Op Note (Signed)
 [facility] Patient Name: Kathleen Navarro Procedure Date: 10/05/2023 7:11 AM MRN: [id number] Endoscopist: Inocente Hausen , MD, [id number] Age: 63 Referring MD:  Date of Birth: September 16, 1960 Gender: Female Account #: [id number] Procedure:                Colonoscopy Indications:              Screening for colorectal malignant neoplasm, Last                            colonoscopy: 2015 Medicines:                Monitored Anesthesia Care Procedure:                Pre-Anesthesia Assessment:                           - Prior to the procedure, a History and Physical                            was performed, and patient medications and                            allergies were reviewed. The patient's tolerance of                            previous anesthesia was also reviewed. The risks                            and benefits of the procedure and the sedation                            options and risks were discussed with the patient.                            All questions were answered, and informed consent                            was obtained. Prior Anticoagulants: The patient has                            taken no anticoagulant or antiplatelet agents. ASA                            Grade Assessment: II - A patient with mild systemic                            disease. After reviewing the risks and benefits,                            the patient was deemed in satisfactory condition to                            undergo the procedure.  After obtaining informed consent, the colonoscope                            was passed under direct vision. Throughout the                            procedure, the patient's blood pressure, pulse, and                            oxygen saturations were monitored continuously. The                            CF [id number] #[id number] was introduced through the anus                            and advanced to the cecum,  identified by                            appendiceal orifice and ileocecal valve. The                            colonoscopy was somewhat difficult due to                            significant looping and a tortuous colon.                            Successful completion of the procedure was aided by                            changing the patient to a supine position and using                            manual pressure. The patient tolerated the                            procedure. The quality of the bowel preparation was                            good. The ileocecal valve, appendiceal orifice, and                            rectum were photographed. Scope In: 7:52:23 AM Scope Out: 8:32:00 AM Scope Withdrawal Time: 0 hours 28 minutes 38 seconds  Total Procedure Duration: 0 hours 39 minutes 37 seconds  Findings:                 Hemorrhoids were found on perianal exam.                           The digital rectal exam was normal. Pertinent                            negatives include normal sphincter tone and no  palpable rectal lesions.                           Three sessile polyps were found in the transverse                            colon. The polyps were 4 to 6 mm in size. These                            polyps were removed with a cold snare. Resection                            and retrieval were complete.                           Three flat and sessile polyps were found in the                            descending colon. The polyps were 8 to 12 mm in                            size. These polyps were removed with a hot snare.                            Resection and retrieval were complete.                           A 10 mm polyp was found in the sigmoid colon. The                            polyp was sessile. The polyp was removed with a hot                            snare. Resection and retrieval were complete.                           A 6 mm  polyp was found in the rectum. The polyp was                            sessile. The polyp was removed with a cold snare.                            Resection and retrieval were complete.                           Internal hemorrhoids were found during retroflexion. Complications:            No immediate complications. Estimated blood loss:                            Minimal. Estimated Blood Loss:     Estimated blood loss was minimal. Impression:               -  Hemorrhoids found on perianal exam.                           - Three 4 to 6 mm polyps in the transverse colon,                            removed with a cold snare. Resected and retrieved.                           - Three 8 to 12 mm polyps in the descending colon,                            removed with a hot snare. Resected and retrieved.                           - One 10 mm polyp in the sigmoid colon, removed                            with a hot snare. Resected and retrieved.                           - One 6 mm polyp in the rectum, removed with a cold                            snare. Resected and retrieved.                           - Internal hemorrhoids. Recommendation:           - Discharge patient to home (ambulatory).                           - Await pathology results.                           - Repeat colonoscopy for surveillance based on                            pathology results.                           - The findings and recommendations were discussed                            with the patient's family.                           - Patient has a contact number available for                            emergencies. The signs and symptoms of potential                            delayed complications were discussed with the  patient. Return to normal activities tomorrow.                            Written discharge instructions were provided to the                             patient. Inocente Hausen, MD 10/05/2023 8:41:58 AM This report has been signed electronically.

## 2023-10-05 NOTE — Progress Notes (Signed)
 Report given to PACU, vss

## 2023-10-05 NOTE — Progress Notes (Signed)
 9189 Patient experiencing nausea and retching.  MD updated and Zofran 4 mg IV given, vss

## 2023-10-05 NOTE — Progress Notes (Signed)
 0815 BP 159/101, Labetalol given IV, MD update, vss

## 2023-10-06 ENCOUNTER — Telehealth: Payer: Self-pay | Admitting: *Deleted

## 2023-10-06 NOTE — Telephone Encounter (Signed)
  Follow up Call-     10/05/2023    7:26 AM  Call back number  Post procedure Call Back phone  # [phone]  Permission to leave phone message Yes     Patient questions:  Do you have a fever, pain , or abdominal swelling? No. Pain Score  0 *  Have you tolerated food without any problems? Yes.    Have you been able to return to your normal activities? Yes.    Do you have any questions about your discharge instructions: Diet   No. Medications  No. Follow up visit  No.  Do you have questions or concerns about your Care? No.  Actions: * If pain score is 4 or above: No action needed, pain <4.

## 2023-10-10 ENCOUNTER — Ambulatory Visit: Payer: Self-pay | Admitting: Pediatrics

## 2023-10-10 LAB — SURGICAL PATHOLOGY

## 2024-02-20 ENCOUNTER — Telehealth: Payer: Self-pay | Admitting: Emergency Medicine

## 2024-02-20 NOTE — Telephone Encounter (Signed)
 Spoke with patient regarding the Friday 03/16/24 11:20 am CT chest appointment at GI [address]---arrival time is 11:00 am--be sure to take photo ID and insurance cards--Pleas reschedule or cancel 24 hours in advance to avoid a $75.00 no Show fee---follow up with Dr. Shelah is Wednesday 04/19/23 at 11:15 am.  Will mail information to patient and she voiced her understanding

## 2024-03-16 ENCOUNTER — Ambulatory Visit
Admission: RE | Admit: 2024-03-16 | Discharge: 2024-03-16 | Disposition: A | Source: Ambulatory Visit | Attending: Emergency Medicine | Admitting: Emergency Medicine

## 2024-03-16 DIAGNOSIS — R918 Other nonspecific abnormal finding of lung field: Secondary | ICD-10-CM

## 2024-03-23 ENCOUNTER — Ambulatory Visit: Payer: Self-pay | Admitting: Emergency Medicine

## 2024-03-26 NOTE — Telephone Encounter (Signed)
 Spoke with patient Kathleen Navarro, see you at next OV

## 2024-03-26 NOTE — Telephone Encounter (Signed)
 X1 VM/ LM return call

## 2024-04-18 ENCOUNTER — Ambulatory Visit: Admitting: Emergency Medicine
